# Patient Record
Sex: Female | Born: 1975 | Race: Black or African American | Hispanic: No | Marital: Married | Smoking: Never smoker
Health system: Southern US, Community
[De-identification: ages and names within clinical notes are randomized; demographics above are authoritative.]

## PROBLEM LIST (undated history)

## (undated) DIAGNOSIS — F329 Major depressive disorder, single episode, unspecified: Secondary | ICD-10-CM

## (undated) DIAGNOSIS — T7840XA Allergy, unspecified, initial encounter: Secondary | ICD-10-CM

## (undated) DIAGNOSIS — D649 Anemia, unspecified: Secondary | ICD-10-CM

## (undated) DIAGNOSIS — J302 Other seasonal allergic rhinitis: Secondary | ICD-10-CM

## (undated) DIAGNOSIS — F32A Depression, unspecified: Secondary | ICD-10-CM

## (undated) DIAGNOSIS — K311 Adult hypertrophic pyloric stenosis: Secondary | ICD-10-CM

## (undated) DIAGNOSIS — B019 Varicella without complication: Secondary | ICD-10-CM

## (undated) DIAGNOSIS — J45909 Unspecified asthma, uncomplicated: Secondary | ICD-10-CM

## (undated) DIAGNOSIS — F419 Anxiety disorder, unspecified: Secondary | ICD-10-CM

## (undated) HISTORY — DX: Adult hypertrophic pyloric stenosis: K31.1

## (undated) HISTORY — DX: Varicella without complication: B01.9

## (undated) HISTORY — DX: Allergy, unspecified, initial encounter: T78.40XA

## (undated) HISTORY — DX: Unspecified asthma, uncomplicated: J45.909

## (undated) HISTORY — DX: Anxiety disorder, unspecified: F41.9

## (undated) HISTORY — DX: Anemia, unspecified: D64.9

## (undated) HISTORY — DX: Other seasonal allergic rhinitis: J30.2

---
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
## 1998-11-19 HISTORY — PX: WISDOM TOOTH EXTRACTION: SHX21

## 1999-04-27 ENCOUNTER — Other Ambulatory Visit: Admission: RE | Admit: 1999-04-27 | Discharge: 1999-04-27 | Payer: Self-pay | Admitting: Gynecology

## 2000-01-15 ENCOUNTER — Emergency Department (HOSPITAL_COMMUNITY): Admission: EM | Admit: 2000-01-15 | Discharge: 2000-01-15 | Payer: Self-pay | Admitting: Emergency Medicine

## 2000-03-26 ENCOUNTER — Ambulatory Visit (HOSPITAL_COMMUNITY): Admission: RE | Admit: 2000-03-26 | Discharge: 2000-03-26 | Payer: Self-pay | Admitting: *Deleted

## 2000-08-12 ENCOUNTER — Observation Stay (HOSPITAL_COMMUNITY): Admission: AD | Admit: 2000-08-12 | Discharge: 2000-08-12 | Payer: Self-pay | Admitting: *Deleted

## 2000-08-13 ENCOUNTER — Inpatient Hospital Stay (HOSPITAL_COMMUNITY): Admission: AD | Admit: 2000-08-13 | Discharge: 2000-08-15 | Payer: Self-pay | Admitting: *Deleted

## 2000-08-24 ENCOUNTER — Emergency Department (HOSPITAL_COMMUNITY): Admission: EM | Admit: 2000-08-24 | Discharge: 2000-08-24 | Payer: Self-pay | Admitting: Emergency Medicine

## 2005-01-19 ENCOUNTER — Observation Stay: Payer: Self-pay | Admitting: Certified Nurse Midwife

## 2005-03-06 ENCOUNTER — Inpatient Hospital Stay: Payer: Self-pay | Admitting: Obstetrics and Gynecology

## 2008-01-05 ENCOUNTER — Ambulatory Visit: Payer: Self-pay | Admitting: Family Medicine

## 2008-01-20 ENCOUNTER — Ambulatory Visit: Payer: Self-pay | Admitting: Family Medicine

## 2008-04-19 HISTORY — PX: GASTRIC BYPASS: SHX52

## 2010-09-14 ENCOUNTER — Encounter: Payer: Self-pay | Admitting: Obstetrics and Gynecology

## 2011-04-27 ENCOUNTER — Ambulatory Visit: Payer: Self-pay | Admitting: Obstetrics and Gynecology

## 2011-04-30 ENCOUNTER — Inpatient Hospital Stay: Payer: Self-pay | Admitting: Obstetrics and Gynecology

## 2011-05-07 ENCOUNTER — Emergency Department: Payer: Self-pay | Admitting: Emergency Medicine

## 2013-12-07 ENCOUNTER — Ambulatory Visit: Payer: Self-pay | Admitting: Obstetrics and Gynecology

## 2013-12-14 ENCOUNTER — Ambulatory Visit: Payer: Self-pay | Admitting: Obstetrics and Gynecology

## 2015-04-26 ENCOUNTER — Encounter: Payer: Self-pay | Admitting: Internal Medicine

## 2015-04-26 ENCOUNTER — Ambulatory Visit (INDEPENDENT_AMBULATORY_CARE_PROVIDER_SITE_OTHER): Payer: BC Managed Care – PPO | Admitting: Internal Medicine

## 2015-04-26 ENCOUNTER — Encounter (INDEPENDENT_AMBULATORY_CARE_PROVIDER_SITE_OTHER): Payer: Self-pay

## 2015-04-26 VITALS — BP 108/70 | HR 60 | Temp 97.8°F | Ht 60.5 in | Wt 152.0 lb

## 2015-04-26 DIAGNOSIS — F329 Major depressive disorder, single episode, unspecified: Secondary | ICD-10-CM | POA: Diagnosis not present

## 2015-04-26 DIAGNOSIS — J4520 Mild intermittent asthma, uncomplicated: Secondary | ICD-10-CM

## 2015-04-26 DIAGNOSIS — J302 Other seasonal allergic rhinitis: Secondary | ICD-10-CM | POA: Diagnosis not present

## 2015-04-26 DIAGNOSIS — J45909 Unspecified asthma, uncomplicated: Secondary | ICD-10-CM | POA: Insufficient documentation

## 2015-04-26 DIAGNOSIS — L709 Acne, unspecified: Secondary | ICD-10-CM | POA: Diagnosis not present

## 2015-04-26 DIAGNOSIS — E663 Overweight: Secondary | ICD-10-CM | POA: Insufficient documentation

## 2015-04-26 DIAGNOSIS — F32A Depression, unspecified: Secondary | ICD-10-CM | POA: Insufficient documentation

## 2015-04-26 LAB — COMPREHENSIVE METABOLIC PANEL
ALT: 11 U/L (ref 0–35)
AST: 12 U/L (ref 0–37)
Albumin: 3.8 g/dL (ref 3.5–5.2)
Alkaline Phosphatase: 73 U/L (ref 39–117)
BUN: 12 mg/dL (ref 6–23)
CO2: 29 mEq/L (ref 19–32)
Calcium: 9.2 mg/dL (ref 8.4–10.5)
Chloride: 104 mEq/L (ref 96–112)
Creatinine, Ser: 0.66 mg/dL (ref 0.40–1.20)
GFR: 129.39 mL/min (ref >60.00)
Glucose, Bld: 81 mg/dL (ref 70–99)
Potassium: 4.2 mEq/L (ref 3.5–5.1)
Sodium: 137 mEq/L (ref 135–145)
Total Bilirubin: 0.4 mg/dL (ref 0.2–1.2)
Total Protein: 7.2 g/dL (ref 6.0–8.3)

## 2015-04-26 LAB — TSH: TSH: 1.19 u[IU]/mL (ref 0.35–4.50)

## 2015-04-26 MED ORDER — BENZOYL PEROXIDE 4 % EX GEL: Freq: Every day | CUTANEOUS | Status: DC

## 2015-04-26 NOTE — Progress Notes (Addendum)
HPI  Pt presents to the clinic today to establish care and for management of the conditions listed below.  Childhood asthma: Has not affected her as an adult.  Seasonal Allergies: Worse in the spring. She takes Careers adviserAllegra as needed.  Depression: This is new for her. She feels like it is being triggered by stress at home and at work. She has never been treated for depression in the past. She denies anxiety, SI/HI. She scored 5/27 on PHQ-9. She is interested in treatment for her depression.  Acne: This started within the last year but has gotten worse in the last 2 months. It is all over her face. She is not using anything topically, just washing face with soap and water.  Obesity: s/p gastric bypass in 2009. She reports she has gained 10 lbs over the last 3 months. She wants to lose about 12 pounds. She has been on Phentermine in the past and is interested in starting back on it if she could.  Flu: 2014 Tetanus: 2012 LMP: amenorrheic due to IUD Pap Smear: 11/2014- normal [facility] Yearly: yearly Dentist: biannually  Past Medical History  Diagnosis Date  . Childhood asthma   . Chicken pox   . Allergy     Current Outpatient Prescriptions  Medication Sig Dispense Refill  . levonorgestrel (MIRENA) 20 MCG/24HR IUD 1 each by Intrauterine route once.     No current facility-administered medications for this visit.    No Known Allergies  Family History  Problem Relation Age of Onset  . Hyperlipidemia Mother   . Hypertension Mother   . Hyperlipidemia Maternal Grandmother   . Heart disease Maternal Grandmother   . Hyperlipidemia Maternal Grandfather   . Heart disease Maternal Grandfather   . Hyperlipidemia Paternal Grandmother   . Heart disease Paternal Grandmother   . Hyperlipidemia Paternal Grandfather     History   Social History  . Marital Status: Single    Spouse Name: N/A  . Number of Children: N/A  . Years of Education: N/A    Occupational History  . Not on file.   Social History Main Topics  . Smoking status: Never Smoker   . Smokeless tobacco: Never Used  . Alcohol Use: No  . Drug Use: No  . Sexual Activity: Not on file   Other Topics Concern  . Not on file   Social History Narrative  . No narrative on file    ROS:  Constitutional: Pt reports weight gain. Denies fever, malaise, fatigue, headache.  HEENT: Denies eye pain, eye redness, ear pain, ringing in the ears, wax buildup, runny nose, nasal congestion, bloody nose, or sore throat. Respiratory: Denies difficulty breathing, shortness of breath, cough or sputum production.   Cardiovascular: Denies chest pain, chest tightness, palpitations or swelling in the hands or feet.  Gastrointestinal: Denies abdominal pain, bloating, constipation, diarrhea or blood in the stool.  GU: Denies frequency, urgency, pain with urination, blood in urine, odor or discharge. Musculoskeletal: Denies decrease in range of motion, difficulty with gait, muscle pain or joint pain and swelling.  Skin: Pt reports acne. Denies redness or ulcercations.  Neurological: Denies dizziness, difficulty with memory, difficulty with speech or problems with balance and coordination.  Psych: Pt reports depression. Denies SI/HI.   No other specific complaints in a complete review of systems (except as listed in HPI above).  PE:  BP 108/70 mmHg  Pulse 60  Temp(Src) 97.8 F (36.6 C) (Oral)  Wt 152 lb (68.947 kg)  SpO2 98% Wt Readings from Last 3 Encounters:  04/26/15 152 lb (68.947 kg)    General: Appears her stated age, well developed, well nourished in NAD. Skin: Acne noted on fast. HEENT: Head: normal shape and size; Eyes: sclera white, no icterus, conjunctiva pink;  Nose: mucosa pink and moist, septum midline; Throat/Mouth: Teeth present, mucosa pink and moist, no lesions or ulcerations noted.  Neck: Neck supple, trachea midline. No masses, lumps or thyromegaly present.   Cardiovascular: Normal rate and rhythm. S1,S2 noted.  No murmur, rubs or gallops noted.  Pulmonary/Chest: Normal effort and positive vesicular breath sounds. No respiratory distress. No wheezes, rales or ronchi noted.  Neurological: Alert and oriented.  Psychiatric: Mood and affect mildly flat. Behavior is normal. Judgment and thought content normal.    Assessment and Plan:

## 2015-04-26 NOTE — Assessment & Plan Note (Signed)
Continue to wash face with soap and water eRx for Benzyl Peroxide, use as directed, discussed side effects of redness and dryness Let me know how things are looking in 4 weeks

## 2015-04-26 NOTE — Assessment & Plan Note (Signed)
Continue Allegra prn

## 2015-04-26 NOTE — Assessment & Plan Note (Signed)
Currently not an issue as an adult Will continue to monitor

## 2015-04-26 NOTE — Patient Instructions (Addendum)
I am checking your Thyroid hormone, if abnormal we will treat this and it may improve your depression. If normal, we will treat your depression with an antidepressant. I have called in a medication for your acne. Let me know if 4 weeks if your acne is not improving.  Depression Depression refers to feeling sad, low, down in the dumps, blue, gloomy, or empty. In general, there are two kinds of depression: 1. Normal sadness or normal grief. This kind of depression is one that we all feel from time to time after upsetting life experiences, such as the loss of a job or the ending of a relationship. This kind of depression is considered normal, is short lived, and resolves within a few days to 2 weeks. Depression experienced after the loss of a loved one (bereavement) often lasts longer than 2 weeks but normally gets better with time. 2. Clinical depression. This kind of depression lasts longer than normal sadness or normal grief or interferes with your ability to function at home, at work, and in school. It also interferes with your personal relationships. It affects almost every aspect of your life. Clinical depression is an illness. Symptoms of depression can also be caused by conditions other than those mentioned above, such as:  Physical illness. Some physical illnesses, including underactive thyroid gland (hypothyroidism), severe anemia, specific types of cancer, diabetes, uncontrolled seizures, heart and lung problems, strokes, and chronic pain are commonly associated with symptoms of depression.  Side effects of some prescription medicine. In some people, certain types of medicine can cause symptoms of depression.  Substance abuse. Abuse of alcohol and illicit drugs can cause symptoms of depression. SYMPTOMS Symptoms of normal sadness and normal grief include the following:  Feeling sad or crying for short periods of time.  Not caring about anything (apathy).  Difficulty sleeping or sleeping  too much.  No longer able to enjoy the things you used to enjoy.  Desire to be by oneself all the time (social isolation).  Lack of energy or motivation.  Difficulty concentrating or remembering.  Change in appetite or weight.  Restlessness or agitation. Symptoms of clinical depression include the same symptoms of normal sadness or normal grief and also the following symptoms:  Feeling sad or crying all the time.  Feelings of guilt or worthlessness.  Feelings of hopelessness or helplessness.  Thoughts of suicide or the desire to harm yourself (suicidal ideation).  Loss of touch with reality (psychotic symptoms). Seeing or hearing things that are not real (hallucinations) or having false beliefs about your life or the people around you (delusions and paranoia). DIAGNOSIS  The diagnosis of clinical depression is usually based on how bad the symptoms are and how long they have lasted. Your health care provider will also ask you questions about your medical history and substance use to find out if physical illness, use of prescription medicine, or substance abuse is causing your depression. Your health care provider may also order blood tests. TREATMENT  Often, normal sadness and normal grief do not require treatment. However, sometimes antidepressant medicine is given for bereavement to ease the depressive symptoms until they resolve. The treatment for clinical depression depends on how bad the symptoms are but often includes antidepressant medicine, counseling with a mental health professional, or both. Your health care provider will help to determine what treatment is best for you. Depression caused by physical illness usually goes away with appropriate medical treatment of the illness. If prescription medicine is causing depression, talk with  your health care provider about stopping the medicine, decreasing the dose, or changing to another medicine. Depression caused by the abuse of  alcohol or illicit drugs goes away when you stop using these substances. Some adults need professional help in order to stop drinking or using drugs. SEEK IMMEDIATE MEDICAL CARE IF:  You have thoughts about hurting yourself or others.  You lose touch with reality (have psychotic symptoms).  You are taking medicine for depression and have a serious side effect. FOR MORE INFORMATION  National Alliance on Mental Illness: www.nami.[employer]  National Institute of Mental Health: http://www.maynard.net/www.nimh.nih.gov Document Released: 11/02/2000 Document Revised: 03/22/2014 Document Reviewed: 02/04/2012 [facility] Patient Information 2015 [place], [place]. This information is not intended to replace advice given to you by your health care provider. Make sure you discuss any questions you have with your health care provider.

## 2015-04-26 NOTE — Progress Notes (Signed)
Pre visit review using our clinic review tool, if applicable. No additional management support is needed unless otherwise documented below in the visit note. 

## 2015-04-26 NOTE — Assessment & Plan Note (Signed)
Support offered today Will check her TSH- if abnormal, will treat this before we treat your depression If TSH normal, will go ahead and treat with SSRI Discussed psychotherapy, this is not something she is interested in at this time.

## 2015-04-26 NOTE — Assessment & Plan Note (Signed)
She is interested in starting Phentermine Advised her that if we are going to treat her depression with a SSRI, then she can not take Phentermine due to the risk of serotonin syndrome She will start working harder or diet and exercise

## 2015-04-27 MED ORDER — SERTRALINE HCL 50 MG PO TABS: 50.0000 mg | ORAL_TABLET | Freq: Every day | ORAL | Status: DC

## 2015-04-27 NOTE — Addendum Note (Signed)
Addended by: Roena MaladyEVONTENNO, Antasia Haider Y on: 04/27/2015 08:40 AM   Modules accepted: Orders

## 2015-06-29 ENCOUNTER — Telehealth: Payer: Self-pay

## 2015-06-29 NOTE — Telephone Encounter (Signed)
Pt left v/m; pt has been taking zoloft 50 mg taking one tab at hs. Pt was to cb with update on how doing on zoloft and pt is doing well on zoloft.

## 2015-06-29 NOTE — Telephone Encounter (Signed)
Noted, thanks for the update 

## 2015-06-30 ENCOUNTER — Other Ambulatory Visit: Payer: Self-pay | Admitting: Internal Medicine

## 2015-06-30 NOTE — Telephone Encounter (Signed)
Ok to refill x 1 year 

## 2015-06-30 NOTE — Telephone Encounter (Signed)
Rx sent through e-scribe  

## 2015-06-30 NOTE — Telephone Encounter (Signed)
Since pt called and gave an update on taking medication and is doing well--please advise if okay to refill for 1 year

## 2015-08-22 ENCOUNTER — Encounter: Payer: Self-pay | Admitting: Internal Medicine

## 2015-08-22 ENCOUNTER — Ambulatory Visit (INDEPENDENT_AMBULATORY_CARE_PROVIDER_SITE_OTHER): Payer: BC Managed Care – PPO | Admitting: Internal Medicine

## 2015-08-22 VITALS — BP 102/74 | HR 66 | Temp 98.2°F | Wt 149.0 lb

## 2015-08-22 DIAGNOSIS — J069 Acute upper respiratory infection, unspecified: Secondary | ICD-10-CM

## 2015-08-22 DIAGNOSIS — T3695XA Adverse effect of unspecified systemic antibiotic, initial encounter: Secondary | ICD-10-CM

## 2015-08-22 DIAGNOSIS — B379 Candidiasis, unspecified: Secondary | ICD-10-CM

## 2015-08-22 MED ORDER — AZITHROMYCIN 250 MG PO TABS: ORAL_TABLET | ORAL | Status: DC

## 2015-08-22 MED ORDER — FLUCONAZOLE 150 MG PO TABS: 150.0000 mg | ORAL_TABLET | Freq: Once | ORAL | Status: DC

## 2015-08-22 NOTE — Progress Notes (Signed)
HPI  Pt presents to the clinic today with c/o fatigue, sore throat, cough and chest congestion. This started 5 days ago. She denies difficulty swallowing. The cough is productive of yellow mucous. She has not checked to see if she is running a fever but she has had chills. She has taken Mucinex and Nyquil with some relief. She does have a history of seasonal allergies. She has not had sick contacts that she is aware of.  Review of Systems      Past Medical History  Diagnosis Date  . Childhood asthma   . Chicken pox   . Allergy     Family History  Problem Relation Age of Onset  . Hyperlipidemia Mother   . Hypertension Mother   . Hyperlipidemia Maternal Grandmother   . Heart disease Maternal Grandmother   . Hyperlipidemia Maternal Grandfather   . Heart disease Maternal Grandfather   . Hyperlipidemia Paternal Grandmother   . Heart disease Paternal Grandmother   . Hyperlipidemia Paternal Grandfather     Social History   Social History  . Marital Status: Single    Spouse Name: N/A  . Number of Children: N/A  . Years of Education: N/A   Occupational History  . Not on file.   Social History Main Topics  . Smoking status: Never Smoker   . Smokeless tobacco: Never Used  . Alcohol Use: No  . Drug Use: No  . Sexual Activity: Yes    Birth Control/ Protection: IUD   Other Topics Concern  . Not on file   Social History Narrative    No Known Allergies   Constitutional: Positive fatigue. Denies headache, fever or abrupt weight changes.  HEENT:  Positive sore throat. Denies eye redness, eye pain, pressure behind the eyes, facial pain, nasal congestion, ear pain, ringing in the ears, wax buildup, runny nose or bloody nose. Respiratory: Positive cough. Denies difficulty breathing or shortness of breath.  Cardiovascular: Denies chest pain, chest tightness, palpitations or swelling in the hands or feet.   No other specific complaints in a complete review of systems (except as  listed in HPI above).  Objective:   BP 102/74 mmHg  Pulse 66  Temp(Src) 98.2 F (36.8 C) (Oral)  Wt 149 lb (67.586 kg)  SpO2 97% Wt Readings from Last 3 Encounters:  08/22/15 149 lb (67.586 kg)  04/26/15 152 lb (68.947 kg)     General: Appears her stated age, well developed, well nourished in NAD. HEENT: Head: normal shape and size; Eyes: sclera white, no icterus, conjunctiva pink; Ears: Tm's gray and intact, normal light reflex; Nose: mucosa pink and moist, septum midline; Throat/Mouth: Teeth present, mucosa pink and moist, no exudate noted, no lesions or ulcerations noted.  Neck: Cervical lymphadenopathy noted.  Cardiovascular: Normal rate and rhythm. S1,S2 noted.  No murmur, rubs or gallops noted.  Pulmonary/Chest: Normal effort and diminished in the RML, RLL. No respiratory distress. No wheezes, rales or ronchi noted.      Assessment & Plan:   Upper Respiratory Infection:  Get some rest and drink plenty of water Do salt water gargles for the sore throat eRx for Azithromax x 5 days eRx for Diflucan 150 mg PO x 1 for antibiotic induced yeast infection Continue Mucinex and Nyquil  Work note to return tomorrow or sooner if better  RTC as needed or if symptoms persist.

## 2015-08-22 NOTE — Patient Instructions (Signed)
Upper Respiratory Infection, Adult An upper respiratory infection (URI) is also sometimes known as the common cold. The upper respiratory tract includes the nose, sinuses, throat, trachea, and bronchi. Bronchi are the airways leading to the lungs. Most people improve within 1 week, but symptoms can last up to 2 weeks. A residual cough may last even longer.  CAUSES Many different viruses can infect the tissues lining the upper respiratory tract. The tissues become irritated and inflamed and often become very moist. Mucus production is also common. A cold is contagious. You can easily spread the virus to others by oral contact. This includes kissing, sharing a glass, coughing, or sneezing. Touching your mouth or nose and then touching a surface, which is then touched by another person, can also spread the virus. SYMPTOMS  Symptoms typically develop 1 to 3 days after you come in contact with a cold virus. Symptoms vary from person to person. They may include:  Runny nose.  Sneezing.  Nasal congestion.  Sinus irritation.  Sore throat.  Loss of voice (laryngitis).  Cough.  Fatigue.  Muscle aches.  Loss of appetite.  Headache.  Low-grade fever. DIAGNOSIS  You might diagnose your own cold based on familiar symptoms, since most people get a cold 2 to 3 times a year. Your caregiver can confirm this based on your exam. Most importantly, your caregiver can check that your symptoms are not due to another disease such as strep throat, sinusitis, pneumonia, asthma, or epiglottitis. Blood tests, throat tests, and X-rays are not necessary to diagnose a common cold, but they may sometimes be helpful in excluding other more serious diseases. Your caregiver will decide if any further tests are required. RISKS AND COMPLICATIONS  You may be at risk for a more severe case of the common cold if you smoke cigarettes, have chronic heart disease (such as heart failure) or lung disease (such as asthma), or if  you have a weakened immune system. The very young and very old are also at risk for more serious infections. Bacterial sinusitis, middle ear infections, and bacterial pneumonia can complicate the common cold. The common cold can worsen asthma and chronic obstructive pulmonary disease (COPD). Sometimes, these complications can require emergency medical care and may be life-threatening. PREVENTION  The best way to protect against getting a cold is to practice good hygiene. Avoid oral or hand contact with people with cold symptoms. Wash your hands often if contact occurs. There is no clear evidence that vitamin C, vitamin E, echinacea, or exercise reduces the chance of developing a cold. However, it is always recommended to get plenty of rest and practice good nutrition. TREATMENT  Treatment is directed at relieving symptoms. There is no cure. Antibiotics are not effective, because the infection is caused by a virus, not by bacteria. Treatment may include:  Increased fluid intake. Sports drinks offer valuable electrolytes, sugars, and fluids.  Breathing heated mist or steam (vaporizer or shower).  Eating chicken soup or other clear broths, and maintaining good nutrition.  Getting plenty of rest.  Using gargles or lozenges for comfort.  Controlling fevers with ibuprofen or acetaminophen as directed by your caregiver.  Increasing usage of your inhaler if you have asthma. Zinc gel and zinc lozenges, taken in the first 24 hours of the common cold, can shorten the duration and lessen the severity of symptoms. Pain medicines may help with fever, muscle aches, and throat pain. A variety of non-prescription medicines are available to treat congestion and runny nose. Your caregiver   can make recommendations and may suggest nasal or lung inhalers for other symptoms.  HOME CARE INSTRUCTIONS   Only take over-the-counter or prescription medicines for pain, discomfort, or fever as directed by your  caregiver.  Use a warm mist humidifier or inhale steam from a shower to increase air moisture. This may keep secretions moist and make it easier to breathe.  Drink enough water and fluids to keep your urine clear or pale yellow.  Rest as needed.  Return to work when your temperature has returned to normal or as your caregiver advises. You may need to stay home longer to avoid infecting others. You can also use a face mask and careful hand washing to prevent spread of the virus. SEEK MEDICAL CARE IF:   After the first few days, you feel you are getting worse rather than better.  You need your caregiver's advice about medicines to control symptoms.  You develop chills, worsening shortness of breath, or brown or red sputum. These may be signs of pneumonia.  You develop yellow or brown nasal discharge or pain in the face, especially when you bend forward. These may be signs of sinusitis.  You develop a fever, swollen neck glands, pain with swallowing, or white areas in the back of your throat. These may be signs of strep throat. SEEK IMMEDIATE MEDICAL CARE IF:   You have a fever.  You develop severe or persistent headache, ear pain, sinus pain, or chest pain.  You develop wheezing, a prolonged cough, cough up blood, or have a change in your usual mucus (if you have chronic lung disease).  You develop sore muscles or a stiff neck. Document Released: 05/01/2001 Document Revised: 01/28/2012 Document Reviewed: 02/10/2014 ExitCare Patient Information 2015 Exit[employer], LLC. This information is not intended to replace advice given to you by your health care provider. Make sure you discuss any questions you have with your health care provider.  

## 2015-08-22 NOTE — Progress Notes (Signed)
Pre visit review using our clinic review tool, if applicable. No additional management support is needed unless otherwise documented below in the visit note. 

## 2015-09-30 ENCOUNTER — Ambulatory Visit (INDEPENDENT_AMBULATORY_CARE_PROVIDER_SITE_OTHER): Payer: BC Managed Care – PPO | Admitting: Family Medicine

## 2015-09-30 ENCOUNTER — Encounter: Payer: Self-pay | Admitting: Family Medicine

## 2015-09-30 ENCOUNTER — Telehealth: Payer: Self-pay | Admitting: Internal Medicine

## 2015-09-30 VITALS — BP 114/80 | HR 51 | Temp 97.8°F | Ht 60.5 in | Wt 150.5 lb

## 2015-09-30 DIAGNOSIS — N907 Vulvar cyst: Secondary | ICD-10-CM | POA: Insufficient documentation

## 2015-09-30 DIAGNOSIS — R350 Frequency of micturition: Secondary | ICD-10-CM

## 2015-09-30 DIAGNOSIS — N3000 Acute cystitis without hematuria: Secondary | ICD-10-CM | POA: Diagnosis not present

## 2015-09-30 DIAGNOSIS — N390 Urinary tract infection, site not specified: Secondary | ICD-10-CM | POA: Insufficient documentation

## 2015-09-30 LAB — POCT URINALYSIS DIPSTICK
Bilirubin, UA: NEGATIVE
Blood, UA: NEGATIVE
Glucose, UA: NEGATIVE
Ketones, UA: NEGATIVE
Nitrite, UA: NEGATIVE
Protein, UA: NEGATIVE
Spec Grav, UA: 1.015
Urobilinogen, UA: 0.2
pH, UA: 6

## 2015-09-30 MED ORDER — FLUCONAZOLE 150 MG PO TABS: 150.0000 mg | ORAL_TABLET | Freq: Once | ORAL | Status: DC

## 2015-09-30 MED ORDER — LEVOFLOXACIN 500 MG PO TABS: 500.0000 mg | ORAL_TABLET | Freq: Every day | ORAL | Status: DC

## 2015-09-30 NOTE — Telephone Encounter (Signed)
Pt made my chart appointment for 11/14 is it ok to wait  See below my chart message  Appointment For: Lisa RibasCANTEY,Lariah T ([id number])    Visit Type: MYCHART OFFICE VISIT (1064)      10/03/2015 11:15 AM 15 mins. Lorre Munroeegina W Baity, NP    [facility]      Patient Comments:   Office Visit   Frequent urination, abdominal pain, back pain

## 2015-09-30 NOTE — Patient Instructions (Signed)
Take levaquin 500 mg once daily with food (for boil and also uti) Drink lots of water Use warm compresses or sitz baths as needed for boil Update if not starting to improve in a week or if worsening    We will contact you with urine culture result when it returns

## 2015-09-30 NOTE — Progress Notes (Signed)
Pre visit review using our clinic review tool, if applicable. No additional management support is needed unless otherwise documented below in the visit note. 

## 2015-09-30 NOTE — Progress Notes (Signed)
Subjective:    Patient ID: Lisa Rosario, female    DOB: 1976-04-18, 39 y.o.   MRN: [id number]  HPI Here with urinary symptoms   Frequent urination 1 week  Also low back pain  No blood in urine Has regular periods  Uses condoms for birth control  A little burning to urinate at the end of urination  No fever or n/v   Has had uti in the past -not often  Results for orders placed or performed in visit on 09/30/15  POCT urinalysis dipstick  Result Value Ref Range   Color, UA Yellow    Clarity, UA Hazy    Glucose, UA Neg.    Bilirubin, UA Neg.    Ketones, UA Neg.    Spec Grav, UA 1.015    Blood, UA Neg.    pH, UA 6.0    Protein, UA Neg.    Urobilinogen, UA 0.2    Nitrite, UA Neg.    Leukocytes, UA small (1+) (A) Negative   does not drink lots of water    Had a boil in vaginal area - got rid of one and another one came up  This one is about a month Is using warm compresses   On chronic metrogel for BV  Patient Active Problem List   Diagnosis Date Noted  . UTI (urinary tract infection) 09/30/2015  . Vulvar cyst 09/30/2015  . Childhood asthma 04/26/2015  . Seasonal allergies 04/26/2015  . Acne 04/26/2015  . Depression 04/26/2015  . Overweight (BMI 25.0-29.9) 04/26/2015   Past Medical History  Diagnosis Date  . Childhood asthma   . Chicken pox   . Allergy    Past Surgical History  Procedure Laterality Date  . Cesarean section    . Gastric bypass  04/2008   Social History  Substance Use Topics  . Smoking status: Never Smoker   . Smokeless tobacco: Never Used  . Alcohol Use: No   Family History  Problem Relation Age of Onset  . Hyperlipidemia Mother   . Hypertension Mother   . Hyperlipidemia Maternal Grandmother   . Heart disease Maternal Grandmother   . Hyperlipidemia Maternal Grandfather   . Heart disease Maternal Grandfather   . Hyperlipidemia Paternal Grandmother   . Heart disease Paternal Grandmother   . Hyperlipidemia Paternal Grandfather     No Known Allergies Current Outpatient Prescriptions on File Prior to Visit  Medication Sig Dispense Refill  . benzoyl peroxide (BREVOXYL) 4 % gel Apply topically at bedtime. 42.5 g 0  . metroNIDAZOLE (METROGEL) 0.75 % vaginal gel twice a week  X 4 months    . sertraline (ZOLOFT) 50 MG tablet TAKE 1 TABLET (50 MG TOTAL) BY MOUTH AT BEDTIME. 30 tablet 11   No current facility-administered medications on file prior to visit.    Review of Systems Review of Systems  Constitutional: Negative for fever, appetite change, fatigue and unexpected weight change.  Eyes: Negative for pain and visual disturbance.  Respiratory: Negative for cough and shortness of breath.   Cardiovascular: Negative for cp or palpitations    Gastrointestinal: Negative for nausea, diarrhea and constipation.  Genitourinary:pos for urgency and frequency.  neg for hematuria and flank pain pos for vulvar boil and pain Skin: Negative for pallor or rash   Neurological: Negative for weakness, light-headedness, numbness and headaches.  Hematological: Negative for adenopathy. Does not bruise/bleed easily.  Psychiatric/Behavioral: Negative for dysphoric mood. The patient is not nervous/anxious.  Objective:   Physical Exam  Constitutional: She appears well-developed and well-nourished. No distress.  overwt and well app  HENT:  Head: Normocephalic and atraumatic.  Eyes: Conjunctivae and EOM are normal. Pupils are equal, round, and reactive to light.  Neck: Normal range of motion. Neck supple.  Cardiovascular: Normal rate, regular rhythm and normal heart sounds.   Pulmonary/Chest: Effort normal and breath sounds normal.  Abdominal: Soft. Bowel sounds are normal. She exhibits no distension. There is tenderness. There is no rebound.  No cva tenderness  Mild suprapubic tenderness  Genitourinary:  1 by 1.5 cm of erythema and induration in fold between labia minora and majora on the L  No drainage Tender but not  fluctuant     Musculoskeletal: She exhibits no edema.  Lymphadenopathy:    She has no cervical adenopathy.  Neurological: She is alert.  Skin: No rash noted. There is erythema.  Psychiatric: She has a normal mood and affect.          Assessment & Plan:   Problem List Items Addressed This Visit      Genitourinary   UTI (urinary tract infection)    Borderline ua with symptoms  Enc water intake  Cover with levaquin pending urine culture  Update if not starting to improve in a week or if worsening        Relevant Medications   fluconazole (DIFLUCAN) 150 MG tablet   Other Relevant Orders   Urine culture   Vulvar cyst    In pt with hx of cysts that turn into boils  This one is not fluctuant -not ready for drainage  Cover with levaquin  Warm compresses frequently  Disc cleansing  Update if not starting to improve in a week or if worsening         Other Visit Diagnoses    Urinary frequency    -  Primary    Relevant Orders    POCT urinalysis dipstick (Completed)

## 2015-10-01 LAB — URINE CULTURE: Colony Count: 50000

## 2015-10-01 NOTE — Assessment & Plan Note (Signed)
In pt with hx of cysts that turn into boils  This one is not fluctuant -not ready for drainage  Cover with levaquin  Warm compresses frequently  Disc cleansing  Update if not starting to improve in a week or if worsening

## 2015-10-01 NOTE — Assessment & Plan Note (Signed)
Borderline ua with symptoms  Enc water intake  Cover with levaquin pending urine culture  Update if not starting to improve in a week or if worsening

## 2015-10-03 ENCOUNTER — Ambulatory Visit: Payer: Self-pay | Admitting: Internal Medicine

## 2015-12-01 ENCOUNTER — Encounter: Payer: Self-pay | Admitting: Internal Medicine

## 2015-12-01 ENCOUNTER — Ambulatory Visit (INDEPENDENT_AMBULATORY_CARE_PROVIDER_SITE_OTHER): Payer: BC Managed Care – PPO | Admitting: Internal Medicine

## 2015-12-01 VITALS — BP 112/70 | HR 61 | Temp 98.0°F | Wt 152.0 lb

## 2015-12-01 DIAGNOSIS — B373 Candidiasis of vulva and vagina: Secondary | ICD-10-CM

## 2015-12-01 DIAGNOSIS — B3731 Acute candidiasis of vulva and vagina: Secondary | ICD-10-CM

## 2015-12-01 MED ORDER — FLUCONAZOLE 100 MG PO TABS: 100.0000 mg | ORAL_TABLET | Freq: Every day | ORAL | Status: DC

## 2015-12-01 NOTE — Progress Notes (Signed)
Subjective:    Patient ID: Lisa RibasJoy T Edmonds, female    DOB: [birth year], 40 y.o.   MRN: [id number]  HPI  Pt presents to the clinic today with c/o vaginal burning and irritation. This started 1 week ago. She denies vaginal discharge or odor. She denies urinary complaints. She has not tried anything OTC. She is sexually active with 1 partner. They do use condoms. She denies douching, scented soaps or use of summer's eve. Her LMP was 11/07/2015.  Review of Systems       Past Medical History  Diagnosis Date  . Childhood asthma   . Chicken pox   . Allergy     Current Outpatient Prescriptions  Medication Sig Dispense Refill  . benzoyl peroxide (BREVOXYL) 4 % gel Apply topically at bedtime. 42.5 g 0  . fluconazole (DIFLUCAN) 150 MG tablet Take 1 tablet (150 mg total) by mouth once. As needed for yeast infection 1 tablet 0  . sertraline (ZOLOFT) 50 MG tablet TAKE 1 TABLET (50 MG TOTAL) BY MOUTH AT BEDTIME. 30 tablet 11  . metroNIDAZOLE (METROGEL) 0.75 % vaginal gel Reported on 12/01/2015     No current facility-administered medications for this visit.    No Known Allergies  Family History  Problem Relation Age of Onset  . Hyperlipidemia Mother   . Hypertension Mother   . Hyperlipidemia Maternal Grandmother   . Heart disease Maternal Grandmother   . Hyperlipidemia Maternal Grandfather   . Heart disease Maternal Grandfather   . Hyperlipidemia Paternal Grandmother   . Heart disease Paternal Grandmother   . Hyperlipidemia Paternal Grandfather     Social History   Social History  . Marital Status: Single    Spouse Name: N/A  . Number of Children: N/A  . Years of Education: N/A   Occupational History  . Not on file.   Social History Main Topics  . Smoking status: Never Smoker   . Smokeless tobacco: Never Used  . Alcohol Use: No  . Drug Use: No  . Sexual Activity: Yes    Birth Control/ Protection: IUD   Other Topics Concern  . Not on file   Social History Narrative      Constitutional: Denies fever, malaise, fatigue, headache or abrupt weight changes.  Respiratory: Denies difficulty breathing, shortness of breath, cough or sputum production.   Cardiovascular: Denies chest pain, chest tightness, palpitations or swelling in the hands or feet.  Gastrointestinal: Denies abdominal pain, bloating, constipation, diarrhea or blood in the stool.  GU: Pt reports vaginal irritation. Denies urgency, frequency, pain with urination, burning sensation, blood in urine, odor or discharge.  No other specific complaints in a complete review of systems (except as listed in HPI above).  Objective:   Physical Exam   BP 112/70 mmHg  Pulse 61  Temp(Src) 98 F (36.7 C) (Oral)  Wt 152 lb (68.947 kg)  SpO2 98%  LMP 11/07/2015 Wt Readings from Last 3 Encounters:  12/01/15 152 lb (68.947 kg)  09/30/15 150 lb 8 oz (68.266 kg)  08/22/15 149 lb (67.586 kg)    General: Appears her stated age, well developed, well nourished in NAD. Cardiovascular: Normal rate and rhythm. S1,S2 noted.  No murmur, rubs or gallops noted.  Pulmonary/Chest: Normal effort and positive vesicular breath sounds. No respiratory distress. No wheezes, rales or ronchi noted.  Abdomen: Soft and nontender. . Pelvic: Normal female anatomy. Thick white discharge noted at the vaginal opening.  BMET    Component Value Date/Time   NA 137  04/26/2015 0956   K 4.2 04/26/2015 0956   CL 104 04/26/2015 0956   CO2 29 04/26/2015 0956   GLUCOSE 81 04/26/2015 0956   BUN 12 04/26/2015 0956   CREATININE 0.66 04/26/2015 0956   CALCIUM 9.2 04/26/2015 0956    Lipid Panel  No results found for: CHOL, TRIG, HDL, CHOLHDL, VLDL, LDLCALC  CBC No results found for: WBC, RBC, HGB, HCT, PLT, MCV, MCH, MCHC, RDW, LYMPHSABS, MONOABS, EOSABS, BASOSABS  Hgb A1C No results found for: HGBA1C      Assessment & Plan:   Candida Vaginitis:  Wet prep: + yeast, no clue eRx for Fluconazole 100 mg daily x 7 days If  returns or persist, I recommend she follow up with GYN  RTC as needed or if symptoms persist or worsen

## 2015-12-01 NOTE — Patient Instructions (Signed)

## 2015-12-01 NOTE — Progress Notes (Signed)
Pre visit review using our clinic review tool, if applicable. No additional management support is needed unless otherwise documented below in the visit note. 

## 2016-01-04 ENCOUNTER — Ambulatory Visit (INDEPENDENT_AMBULATORY_CARE_PROVIDER_SITE_OTHER): Payer: BC Managed Care – PPO | Admitting: Internal Medicine

## 2016-01-04 ENCOUNTER — Encounter: Payer: Self-pay | Admitting: Internal Medicine

## 2016-01-04 VITALS — BP 106/70 | HR 81 | Temp 99.8°F | Wt 156.0 lb

## 2016-01-04 DIAGNOSIS — B379 Candidiasis, unspecified: Secondary | ICD-10-CM

## 2016-01-04 DIAGNOSIS — J029 Acute pharyngitis, unspecified: Secondary | ICD-10-CM

## 2016-01-04 DIAGNOSIS — T3695XA Adverse effect of unspecified systemic antibiotic, initial encounter: Principal | ICD-10-CM

## 2016-01-04 LAB — POCT RAPID STREP A (OFFICE): RAPID STREP A SCREEN: NEGATIVE

## 2016-01-04 MED ORDER — AMOXICILLIN 500 MG PO CAPS: 500.0000 mg | ORAL_CAPSULE | Freq: Three times a day (TID) | ORAL | Status: DC

## 2016-01-04 MED ORDER — FLUCONAZOLE 100 MG PO TABS: 100.0000 mg | ORAL_TABLET | Freq: Every day | ORAL | Status: DC

## 2016-01-04 NOTE — Addendum Note (Signed)
Addended by: Roena Malady on: 01/04/2016 01:17 PM   Modules accepted: Orders

## 2016-01-04 NOTE — Progress Notes (Signed)
Pre visit review using our clinic review tool, if applicable. No additional management support is needed unless otherwise documented below in the visit note. 

## 2016-01-04 NOTE — Progress Notes (Signed)
Subjective:    Patient ID: Lisa Rosario, female    DOB: 1976-02-25, 40 y.o.   MRN: [id number]  HPI  Pt presents to the clinic today with c/o sore throat. This started yesterday. She has some associated facial pressure, but denies runny nose, nasal congestion, ear fullness or cough. She has run low grade fevers, but denies chills or body aches. She has not noticed a rash. She has not taken anything OTC. She does have a history of seasonal allergies and childhood asthma. She reports her child was recently diagnosed with Scarlet Fever.  Review of Systems  Past Medical History  Diagnosis Date  . Childhood asthma   . Chicken pox   . Allergy     Current Outpatient Prescriptions  Medication Sig Dispense Refill  . sertraline (ZOLOFT) 50 MG tablet TAKE 1 TABLET (50 MG TOTAL) BY MOUTH AT BEDTIME. 30 tablet 11   No current facility-administered medications for this visit.    No Known Allergies  Family History  Problem Relation Age of Onset  . Hyperlipidemia Mother   . Hypertension Mother   . Hyperlipidemia Maternal Grandmother   . Heart disease Maternal Grandmother   . Hyperlipidemia Maternal Grandfather   . Heart disease Maternal Grandfather   . Hyperlipidemia Paternal Grandmother   . Heart disease Paternal Grandmother   . Hyperlipidemia Paternal Grandfather     Social History   Social History  . Marital Status: Single    Spouse Name: N/A  . Number of Children: N/A  . Years of Education: N/A   Occupational History  . Not on file.   Social History Main Topics  . Smoking status: Never Smoker   . Smokeless tobacco: Never Used  . Alcohol Use: No  . Drug Use: No  . Sexual Activity: Yes    Birth Control/ Protection: IUD   Other Topics Concern  . Not on file   Social History Narrative     Constitutional: Denies fever, malaise, fatigue, headache or abrupt weight changes.  HEENT: Pt reports sore throat. Denies eye pain, eye redness, ear pain, ringing in the ears, wax  buildup, runny nose, nasal congestion, bloody nose. Respiratory: Denies difficulty breathing, shortness of breath, cough or sputum production.   Cardiovascular: Denies chest pain, chest tightness, palpitations or swelling in the hands or feet.  Gastrointestinal: Denies abdominal pain, bloating, constipation, diarrhea or blood in the stool.  Skin: Denies redness, rashes, lesions or ulcercations.    No other specific complaints in a complete review of systems (except as listed in HPI above).     Objective:   Physical Exam  BP 106/70 mmHg  Pulse 81  Temp(Src) 99.8 F (37.7 C) (Oral)  Wt 156 lb (70.761 kg)  SpO2 98%  LMP 12/07/2015 Wt Readings from Last 3 Encounters:  01/04/16 156 lb (70.761 kg)  12/01/15 152 lb (68.947 kg)  09/30/15 150 lb 8 oz (68.266 kg)    General: Appears her stated age, ill appearing in NAD. Skin: Warm, dry and intact. No rashes, lesions or ulcerations noted. HEENT: Head: normal shape and size, no sinus tenderness noted; Eyes: sclera white, no icterus, conjunctiva pink; Ears: Tm's gray and intact, normal light reflex; Throat/Mouth: Teeth present, mucosa erythematous and moist, tonsil 2+, white patchy exudate noted on bilateral tonsils. No lesions or ulcerations noted.  Neck:  No adenopathy noted. Cardiovascular: Normal rate and rhythm. S1,S2 noted.   Pulmonary/Chest: Normal effort and positive vesicular breath sounds. No respiratory distress. No wheezes, rales or ronchi noted.  BMET    Component Value Date/Time   NA 137 04/26/2015 0956   K 4.2 04/26/2015 0956   CL 104 04/26/2015 0956   CO2 29 04/26/2015 0956   GLUCOSE 81 04/26/2015 0956   BUN 12 04/26/2015 0956   CREATININE 0.66 04/26/2015 0956   CALCIUM 9.2 04/26/2015 0956          Assessment & Plan:   Sore throat:  Rapid strep: neg Will send throat culture Will treat based on [facility] criteria eRx for Amoxil 500 mg TID x 10 days eRx for Diflucan 150 mg PO x 1 for antibiotic induced yeast  infection Advised rest, fluids and Ibuprofen for fever  Return precautions given

## 2016-01-04 NOTE — Patient Instructions (Signed)

## 2016-01-06 LAB — CULTURE, GROUP A STREP

## 2016-01-18 ENCOUNTER — Other Ambulatory Visit: Payer: Self-pay

## 2016-01-18 MED ORDER — SERTRALINE HCL 50 MG PO TABS: 50.0000 mg | ORAL_TABLET | Freq: Every day | ORAL | Status: DC

## 2016-05-10 ENCOUNTER — Ambulatory Visit: Payer: BC Managed Care – PPO | Admitting: Internal Medicine

## 2016-05-17 ENCOUNTER — Ambulatory Visit (INDEPENDENT_AMBULATORY_CARE_PROVIDER_SITE_OTHER): Payer: BC Managed Care – PPO | Admitting: Internal Medicine

## 2016-05-17 ENCOUNTER — Encounter: Payer: Self-pay | Admitting: Internal Medicine

## 2016-05-17 VITALS — BP 104/60 | HR 55 | Temp 97.8°F | Wt 168.8 lb

## 2016-05-17 DIAGNOSIS — F32A Depression, unspecified: Secondary | ICD-10-CM

## 2016-05-17 DIAGNOSIS — J4520 Mild intermittent asthma, uncomplicated: Secondary | ICD-10-CM | POA: Diagnosis not present

## 2016-05-17 DIAGNOSIS — L709 Acne, unspecified: Secondary | ICD-10-CM

## 2016-05-17 DIAGNOSIS — J302 Other seasonal allergic rhinitis: Secondary | ICD-10-CM | POA: Diagnosis not present

## 2016-05-17 DIAGNOSIS — R61 Generalized hyperhidrosis: Secondary | ICD-10-CM

## 2016-05-17 DIAGNOSIS — F329 Major depressive disorder, single episode, unspecified: Secondary | ICD-10-CM

## 2016-05-17 DIAGNOSIS — E663 Overweight: Secondary | ICD-10-CM

## 2016-05-17 MED ORDER — FLUOXETINE HCL 20 MG PO TABS: 20.0000 mg | ORAL_TABLET | Freq: Every day | ORAL | Status: DC

## 2016-05-17 NOTE — Progress Notes (Signed)
HPI  Pt presents to the clinic today for follow up of chronic conditions.  Childhood asthma: Has not affected her as an adult.  Seasonal Allergies: Worse in the spring. She takes Careers adviserAllegra as needed.  Depression: She feels like it is being triggered by stress at home and at work. She is doing well on her current dose of Zoloft although she is concerned that she has gained some weight. She would like to consider different options.  Acne: This started within the last year but has gotten worse in the last 2 months. It is all over her face. She is not using anything topically, just washing face with soap and water.  Obesity: s/p gastric bypass in 2009. She reports she has gained 16 lbs over the last 6 months.   Excessive sweating: She is taking Glycopyrrolate with good results. She does have some dry mouth but she can deal with.  Past Medical History  Diagnosis Date  . Childhood asthma   . Chicken pox   . Allergy     Current Outpatient Prescriptions  Medication Sig Dispense Refill  . amoxicillin (AMOXIL) 500 MG capsule Take 1 capsule (500 mg total) by mouth 3 (three) times daily. 30 capsule 0  . fluconazole (DIFLUCAN) 100 MG tablet Take 1 tablet (100 mg total) by mouth daily. 14 tablet 0  . sertraline (ZOLOFT) 50 MG tablet Take 1 tablet (50 mg total) by mouth at bedtime. 90 tablet 1   No current facility-administered medications for this visit.    No Known Allergies  Family History  Problem Relation Age of Onset  . Hyperlipidemia Mother   . Hypertension Mother   . Hyperlipidemia Maternal Grandmother   . Heart disease Maternal Grandmother   . Hyperlipidemia Maternal Grandfather   . Heart disease Maternal Grandfather   . Hyperlipidemia Paternal Grandmother   . Heart disease Paternal Grandmother   . Hyperlipidemia Paternal Grandfather     Social History   Social History  . Marital Status: Single    Spouse Name: N/A  . Number of Children: N/A  . Years of Education: N/A    Occupational History  . Not on file.   Social History Main Topics  . Smoking status: Never Smoker   . Smokeless tobacco: Never Used  . Alcohol Use: No  . Drug Use: No  . Sexual Activity: Yes    Birth Control/ Protection: IUD   Other Topics Concern  . Not on file   Social History Narrative    ROS:  Constitutional: Pt reports weight gain. Denies fever, malaise, fatigue, headache.  HEENT: Denies eye pain, eye redness, ear pain, ringing in the ears, wax buildup, runny nose, nasal congestion, bloody nose, or sore throat. Respiratory: Denies difficulty breathing, shortness of breath, cough or sputum production.   Cardiovascular: Denies chest pain, chest tightness, palpitations or swelling in the hands or feet.  Gastrointestinal: Denies abdominal pain, bloating, constipation, diarrhea or blood in the stool.  GU: Denies frequency, urgency, pain with urination, blood in urine, odor or discharge. Musculoskeletal: Denies decrease in range of motion, difficulty with gait, muscle pain or joint pain and swelling.  Skin: Pt reports acne. Denies redness or ulcercations.  Neurological: Denies dizziness, difficulty with memory, difficulty with speech or problems with balance and coordination.  Psych: Pt reports depression. Denies SI/HI.   No other specific complaints in a complete review of systems (except as listed in HPI above).  PE:  BP 104/60 mmHg  Pulse 55  Temp(Src) 97.8 F (36.6  C) (Oral)  Wt 168 lb 12 oz (76.544 kg)  SpO2 98%  LMP 05/06/2016  Wt Readings from Last 3 Encounters:  01/04/16 156 lb (70.761 kg)  12/01/15 152 lb (68.947 kg)  09/30/15 150 lb 8 oz (68.266 kg)    General: Appears her stated age, obese in NAD. Skin: Acne noted on fast. HEENT: Head: normal shape and size; Eyes: sclera white, no icterus, conjunctiva pink;  Nose: mucosa pink and moist, septum midline; Throat/Mouth: Teeth present, mucosa pink and moist, no lesions or ulcerations noted.   Cardiovascular: Normal rate and rhythm. S1,S2 noted.  No murmur, rubs or gallops noted.  Pulmonary/Chest: Normal effort and positive vesicular breath sounds. No respiratory distress. No wheezes, rales or ronchi noted.  Neurological: Alert and oriented.  Psychiatric: Mood and affect mildly flat. Behavior is normal. Judgment and thought content normal.    Assessment and Plan:

## 2016-05-17 NOTE — Assessment & Plan Note (Signed)
Encouraged her to work on diet and exercise 

## 2016-05-17 NOTE — Assessment & Plan Note (Signed)
She wants to continue conservative care at this time Will monitor

## 2016-05-17 NOTE — Assessment & Plan Note (Signed)
Improved but she is upset about weight gain Stop Zoloft, start Prozac Try to increase exercise  Follow up with in 4 weeks via mychart

## 2016-05-17 NOTE — Assessment & Plan Note (Signed)
Controlled on Glycopyrrolate Will monitor

## 2016-05-17 NOTE — Assessment & Plan Note (Signed)
Currently not an issue Will monitor 

## 2016-05-17 NOTE — Assessment & Plan Note (Signed)
Continue Allegra as needed 

## 2016-05-17 NOTE — Patient Instructions (Signed)

## 2016-05-17 NOTE — Progress Notes (Signed)
Pre visit review using our clinic review tool, if applicable. No additional management support is needed unless otherwise documented below in the visit note. 

## 2016-08-09 ENCOUNTER — Other Ambulatory Visit: Payer: Self-pay | Admitting: Internal Medicine

## 2016-08-12 ENCOUNTER — Encounter: Payer: Self-pay | Admitting: Internal Medicine

## 2016-08-17 ENCOUNTER — Ambulatory Visit (INDEPENDENT_AMBULATORY_CARE_PROVIDER_SITE_OTHER): Payer: BC Managed Care – PPO | Admitting: Internal Medicine

## 2016-08-17 ENCOUNTER — Encounter: Payer: Self-pay | Admitting: Internal Medicine

## 2016-08-17 VITALS — BP 110/74 | HR 51 | Temp 98.2°F | Wt 164.2 lb

## 2016-08-17 DIAGNOSIS — Z23 Encounter for immunization: Secondary | ICD-10-CM

## 2016-08-17 DIAGNOSIS — F32A Depression, unspecified: Secondary | ICD-10-CM

## 2016-08-17 DIAGNOSIS — E669 Obesity, unspecified: Secondary | ICD-10-CM | POA: Diagnosis not present

## 2016-08-17 DIAGNOSIS — B3731 Acute candidiasis of vulva and vagina: Secondary | ICD-10-CM

## 2016-08-17 DIAGNOSIS — F329 Major depressive disorder, single episode, unspecified: Secondary | ICD-10-CM | POA: Diagnosis not present

## 2016-08-17 DIAGNOSIS — B373 Candidiasis of vulva and vagina: Secondary | ICD-10-CM

## 2016-08-17 LAB — HEMOGLOBIN A1C: Hgb A1c MFr Bld: 4.8 % (ref 4.6–6.5)

## 2016-08-17 MED ORDER — NALTREXONE-BUPROPION HCL ER 8-90 MG PO TB12: ORAL_TABLET | ORAL | 0 refills | Status: DC

## 2016-08-17 MED ORDER — SERTRALINE HCL 50 MG PO TABS: 50.0000 mg | ORAL_TABLET | Freq: Every day | ORAL | 1 refills | Status: DC

## 2016-08-17 NOTE — Progress Notes (Signed)
Subjective:    Patient ID: Lisa Rosario, female    DOB: [birth year], 40 y.o.   MRN: [id number]  HPI  Pt presents to the clinic today to follow up depression. She reports her depression is triggered by stress at home and at work. She was treated with Zoloft but stopped it secondary to weight gain. She was started on Prozac 04/2016 but reports she did not feel like it was effective, so she switched back to the Zoloft. She is interested in trying Contrave because she heard that can help with depression and weight loss. She had a bypass in 2009, but gained 16 lb last year. She has lost 4 lbs in the last 3 months.  She also reports recurrent vaginal yeast infections. This occurs monthly after her menses. She does not douche, use summer eves products, or bubble baths. She takes Monistat as needed with good relief.  Review of Systems      Past Medical History:  Diagnosis Date  . Allergy   . Chicken pox   . Childhood asthma     Current Outpatient Prescriptions  Medication Sig Dispense Refill  . FLUoxetine (PROZAC) 20 MG tablet Take 1 tablet (20 mg total) by mouth daily. 30 tablet 2  . glycopyrrolate (ROBINUL) 2 MG tablet Take 2 mg by mouth daily.     . sertraline (ZOLOFT) 50 MG tablet Take 1 tablet (50 mg total) by mouth at bedtime. 90 tablet 1   No current facility-administered medications for this visit.     No Known Allergies  Family History  Problem Relation Age of Onset  . Hyperlipidemia Mother   . Hypertension Mother   . Hyperlipidemia Maternal Grandmother   . Heart disease Maternal Grandmother   . Hyperlipidemia Maternal Grandfather   . Heart disease Maternal Grandfather   . Hyperlipidemia Paternal Grandmother   . Heart disease Paternal Grandmother   . Hyperlipidemia Paternal Grandfather     Social History   Social History  . Marital status: Single    Spouse name: N/A  . Number of children: N/A  . Years of education: N/A   Occupational History  . Not on file.    Social History Main Topics  . Smoking status: Never Smoker  . Smokeless tobacco: Never Used  . Alcohol use No  . Drug use: No  . Sexual activity: Yes    Birth control/ protection: IUD   Other Topics Concern  . Not on file   Social History Narrative  . No narrative on file     Constitutional: Denies fever, malaise, fatigue, headache or abrupt weight changes.  Respiratory: Denies difficulty breathing, shortness of breath, cough or sputum production.   Cardiovascular: Denies chest pain, chest tightness, palpitations or swelling in the hands or feet.  Gastrointestinal: Denies abdominal pain, bloating, constipation, diarrhea or blood in the stool.  GU: Denies urgency, frequency, pain with urination, burning sensation, blood in urine, odor or discharge. Neurological: Denies dizziness, difficulty with memory, difficulty with speech or problems with balance and coordination.  Psych: Pt has history of depression. Denies anxiety, SI/HI.  No other specific complaints in a complete review of systems (except as listed in HPI above).  Objective:   Physical Exam  BP 110/74   Pulse (!) 51   Temp 98.2 F (36.8 C) (Oral)   Wt 164 lb 4 oz (74.5 kg)   LMP 07/31/2016   SpO2 99%   BMI 31.55 kg/m  Wt Readings from Last 3 Encounters:  08/17/16 164  lb 4 oz (74.5 kg)  05/17/16 168 lb 12 oz (76.5 kg)  01/04/16 156 lb (70.8 kg)    General: Appears her stated age, obese in NAD. Cardiovascular: Normal rate and rhythm. S1,S2 noted.  No murmur, rubs or gallops noted.  Pulmonary/Chest: Normal effort and positive vesicular breath sounds. No respiratory distress. No wheezes, rales or ronchi noted.  Abdomen: Soft and nontender.  Neurological: Alert and oriented.  Psychiatric: Mood and affect normal. Behavior is normal. Judgment and thought content normal.     BMET    Component Value Date/Time   NA 137 04/26/2015 0956   K 4.2 04/26/2015 0956   CL 104 04/26/2015 0956   CO2 29 04/26/2015 0956    GLUCOSE 81 04/26/2015 0956   BUN 12 04/26/2015 0956   CREATININE 0.66 04/26/2015 0956   CALCIUM 9.2 04/26/2015 0956    Lipid Panel  No results found for: CHOL, TRIG, HDL, CHOLHDL, VLDL, LDLCALC  CBC No results found for: WBC, RBC, HGB, HCT, PLT, MCV, MCH, MCHC, RDW, LYMPHSABS, MONOABS, EOSABS, BASOSABS  Hgb A1C No results found for: HGBA1C          Assessment & Plan:   Recurrent vaginal candidiasis:  No identifying environmental factors Will check A1C today  RTC in 1 month for weight check and med refill Nicki ReaperBAITY, Kelse Ploch, NP

## 2016-08-17 NOTE — Assessment & Plan Note (Signed)
Discussed continue Zoloft for now- she agrees with this plan Zoloft refilled today

## 2016-08-17 NOTE — Assessment & Plan Note (Signed)
Encouraged her to work on diet and exercise eRx for Contrave, discussed not taking with narcotics- she understands and agrees

## 2016-08-17 NOTE — Patient Instructions (Signed)
Major Depressive Disorder Major depressive disorder is a mental illness. It also may be called clinical depression or unipolar depression. Major depressive disorder usually causes feelings of sadness, hopelessness, or helplessness. Some people with this disorder do not feel particularly sad but lose interest in doing things they used to enjoy (anhedonia). Major depressive disorder also can cause physical symptoms. It can interfere with work, school, relationships, and other normal everyday activities. The disorder varies in severity but is longer lasting and more serious than the sadness we all feel from time to time in our lives. Major depressive disorder often is triggered by stressful life events or major life changes. Examples of these triggers include divorce, loss of your job or home, a move, and the death of a family member or close friend. Sometimes this disorder occurs for no obvious reason at all. People who have family members with major depressive disorder or bipolar disorder are at higher risk for developing this disorder, with or without life stressors. Major depressive disorder can occur at any age. It may occur just once in your life (single episode major depressive disorder). It may occur multiple times (recurrent major depressive disorder). SYMPTOMS People with major depressive disorder have either anhedonia or depressed mood on nearly a daily basis for at least 2 weeks or longer. Symptoms of depressed mood include:  Feelings of sadness (blue or down in the dumps) or emptiness.  Feelings of hopelessness or helplessness.  Tearfulness or episodes of crying (may be observed by others).  Irritability (children and adolescents). In addition to depressed mood or anhedonia or both, people with this disorder have at least four of the following symptoms:  Difficulty sleeping or sleeping too much.   Significant change (increase or decrease) in appetite or weight.   Lack of energy or  motivation.  Feelings of guilt and worthlessness.   Difficulty concentrating, remembering, or making decisions.  Unusually slow movement (psychomotor retardation) or restlessness (as observed by others).   Recurrent wishes for death, recurrent thoughts of self-harm (suicide), or a suicide attempt. People with major depressive disorder commonly have persistent negative thoughts about themselves, other people, and the world. People with severe major depressive disorder may experiencedistorted beliefs or perceptions about the world (psychotic delusions). They also may see or hear things that are not real (psychotic hallucinations). DIAGNOSIS Major depressive disorder is diagnosed through an assessment by your health care provider. Your health care provider will ask aboutaspects of your daily life, such as mood,sleep, and appetite, to see if you have the diagnostic symptoms of major depressive disorder. Your health care provider may ask about your medical history and use of alcohol or drugs, including prescription medicines. Your health care provider also may do a physical exam and blood work. This is because certain medical conditions and the use of certain substances can cause major depressive disorder-like symptoms (secondary depression). Your health care provider also may refer you to a mental health specialist for further evaluation and treatment. TREATMENT It is important to recognize the symptoms of major depressive disorder and seek treatment. The following treatments can be prescribed for this disorder:   Medicine. Antidepressant medicines usually are prescribed. Antidepressant medicines are thought to correct chemical imbalances in the brain that are commonly associated with major depressive disorder. Other types of medicine may be added if the symptoms do not respond to antidepressant medicines alone or if psychotic delusions or hallucinations occur.  Talk therapy. Talk therapy can be  helpful in treating major depressive disorder by providing   support, education, and guidance. Certain types of talk therapy also can help with negative thinking (cognitive behavioral therapy) and with relationship issues that trigger this disorder (interpersonal therapy). A mental health specialist can help determine which treatment is best for you. Most people with major depressive disorder do well with a combination of medicine and talk therapy. Treatments involving electrical stimulation of the brain can be used in situations with extremely severe symptoms or when medicine and talk therapy do not work over time. These treatments include electroconvulsive therapy, transcranial magnetic stimulation, and vagal nerve stimulation.   This information is not intended to replace advice given to you by your health care provider. Make sure you discuss any questions you have with your health care provider.   Document Released: 03/02/2013 Document Revised: 11/26/2014 Document Reviewed: 03/02/2013 Elsevier Interactive Patient Education 2016 Elsevier Inc.  

## 2016-08-20 ENCOUNTER — Telehealth: Payer: Self-pay

## 2016-08-20 NOTE — Addendum Note (Signed)
Addended by: Roena MaladyEVONTENNO, Xerxes Agrusa Y on: 08/20/2016 04:51 PM   Modules accepted: Orders

## 2016-08-20 NOTE — Telephone Encounter (Signed)
PA has been submitted to [facility] through [email or web address]...awaiting response

## 2016-09-14 ENCOUNTER — Encounter: Payer: Self-pay | Admitting: Internal Medicine

## 2016-09-14 ENCOUNTER — Ambulatory Visit (INDEPENDENT_AMBULATORY_CARE_PROVIDER_SITE_OTHER): Payer: BC Managed Care – PPO | Admitting: Internal Medicine

## 2016-09-14 VITALS — BP 106/64 | HR 66 | Temp 98.7°F | Wt 162.8 lb

## 2016-09-14 DIAGNOSIS — F341 Dysthymic disorder: Secondary | ICD-10-CM

## 2016-09-14 DIAGNOSIS — R635 Abnormal weight gain: Secondary | ICD-10-CM | POA: Diagnosis not present

## 2016-09-14 MED ORDER — NALTREXONE-BUPROPION HCL ER 8-90 MG PO TB12: 2.0000 | ORAL_TABLET | Freq: Two times a day (BID) | ORAL | 1 refills | Status: DC

## 2016-09-14 NOTE — Progress Notes (Signed)
Subjective:    Patient ID: Tami Ribas, female    DOB: 01-18-1976, 40 y.o.   MRN: [id number]  HPI  Pt presents to the clinic today to follow up depression and abnormal weight gain. She was started on Contrave 9/29, but because of needing a PA, she only started taking the medication 2 weeks ago. Her starting weight was 164.25. Today's weight is 162.75, BMI of 31.26. She is still doing weight watchers, counting points. No exercise at this time. She reports her depression is about the same. She continues to take the Zoloft. She denies SI/HI.  Review of Systems      Past Medical History:  Diagnosis Date  . Allergy   . Chicken pox   . Childhood asthma     Current Outpatient Prescriptions  Medication Sig Dispense Refill  . glycopyrrolate (ROBINUL) 2 MG tablet Take 2 mg by mouth daily.     . Naltrexone-Bupropion HCl ER 8-90 MG TB12 Take 1 tab in am x 1 week. Take 1 tab in the am and the pm x 1 week. Take 2 tabs in the am and 1 tab in the pm x 1 week. Take 2 tabs in am and 2 tabs in pm thereafter. 120 tablet 0  . sertraline (ZOLOFT) 50 MG tablet Take 1 tablet (50 mg total) by mouth daily. 90 tablet 1   No current facility-administered medications for this visit.     No Known Allergies  Family History  Problem Relation Age of Onset  . Hyperlipidemia Mother   . Hypertension Mother   . Hyperlipidemia Maternal Grandmother   . Heart disease Maternal Grandmother   . Hyperlipidemia Maternal Grandfather   . Heart disease Maternal Grandfather   . Hyperlipidemia Paternal Grandmother   . Heart disease Paternal Grandmother   . Hyperlipidemia Paternal Grandfather     Social History   Social History  . Marital status: Single    Spouse name: N/A  . Number of children: N/A  . Years of education: N/A   Occupational History  . Not on file.   Social History Main Topics  . Smoking status: Never Smoker  . Smokeless tobacco: Never Used  . Alcohol use No  . Drug use: No  . Sexual  activity: Yes    Birth control/ protection: IUD   Other Topics Concern  . Not on file   Social History Narrative  . No narrative on file     Constitutional: Denies fever, malaise, fatigue, headache or abrupt weight changes.  Respiratory: Denies difficulty breathing, shortness of breath, cough or sputum production.   Cardiovascular: Denies chest pain, chest tightness, palpitations or swelling in the hands or feet.  Neurological: Denies dizziness, difficulty with memory, difficulty with speech or problems with balance and coordination.  Psych: Pt reports depression. Denies anxiety, SI/HI.  No other specific complaints in a complete review of systems (except as listed in HPI above).  Objective:   Physical Exam  BP 106/64   Pulse 66   Temp 98.7 F (37.1 C) (Oral)   Wt 162 lb 12 oz (73.8 kg)   LMP 08/30/2016   SpO2 98%   BMI 31.26 kg/m  Wt Readings from Last 3 Encounters:  09/14/16 162 lb 12 oz (73.8 kg)  08/17/16 164 lb 4 oz (74.5 kg)  05/17/16 168 lb 12 oz (76.5 kg)    General: Appears herstated age, obese in NAD. Cardiovascular: Normal rate and rhythm. S1,S2 noted.  No murmur, rubs or gallops noted.  Pulmonary/Chest:  Normal effort and positive vesicular breath sounds. No respiratory distress. No wheezes, rales or ronchi noted.   Neurological: Alert and oriented.  Psychiatric: Mood and affect mildly flat. Behavior is normal. Judgment and thought content normal.     BMET    Component Value Date/Time   NA 137 04/26/2015 0956   K 4.2 04/26/2015 0956   CL 104 04/26/2015 0956   CO2 29 04/26/2015 0956   GLUCOSE 81 04/26/2015 0956   BUN 12 04/26/2015 0956   CREATININE 0.66 04/26/2015 0956   CALCIUM 9.2 04/26/2015 0956    Lipid Panel  No results found for: CHOL, TRIG, HDL, CHOLHDL, VLDL, LDLCALC  CBC No results found for: WBC, RBC, HGB, HCT, PLT, MCV, MCH, MCHC, RDW, LYMPHSABS, MONOABS, EOSABS, BASOSABS  Hgb A1C Lab Results  Component Value Date   HGBA1C 4.8  08/17/2016       Assessment & Plan:   Depression:  Support offered today Continue Zoloft and Contrave  Abnormal weight gain:  Will give the Contrave more time to work Continue [employer]weight watchers diet Encouraged exercise Contrave refilled today  RTC in 2 months for weight check/med refill Nicki ReaperBAITY, Wilmoth Rasnic, NP

## 2016-09-14 NOTE — Patient Instructions (Signed)
Allergic Rhinitis Allergic rhinitis is when the mucous membranes in the nose respond to allergens. Allergens are particles in the air that cause your body to have an allergic reaction. This causes you to release allergic antibodies. Through a chain of events, these eventually cause you to release histamine into the blood stream. Although meant to protect the body, it is this release of histamine that causes your discomfort, such as frequent sneezing, congestion, and an itchy, runny nose.  CAUSES Seasonal allergic rhinitis (hay fever) is caused by pollen allergens that may come from grasses, trees, and weeds. Year-round allergic rhinitis (perennial allergic rhinitis) is caused by allergens such as house dust mites, pet dander, and mold spores. SYMPTOMS  Nasal stuffiness (congestion).  Itchy, runny nose with sneezing and tearing of the eyes. DIAGNOSIS Your health care provider can help you determine the allergen or allergens that trigger your symptoms. If you and your health care provider are unable to determine the allergen, skin or blood testing may be used. Your health care provider will diagnose your condition after taking your health history and performing a physical exam. Your health care provider may assess you for other related conditions, such as asthma, pink eye, or an ear infection. TREATMENT Allergic rhinitis does not have a cure, but it can be controlled by:  Medicines that block allergy symptoms. These may include allergy shots, nasal sprays, and oral antihistamines.  Avoiding the allergen. Hay fever may often be treated with antihistamines in pill or nasal spray forms. Antihistamines block the effects of histamine. There are over-the-counter medicines that may help with nasal congestion and swelling around the eyes. Check with your health care provider before taking or giving this medicine. If avoiding the allergen or the medicine prescribed do not work, there are many new medicines  your health care provider can prescribe. Stronger medicine may be used if initial measures are ineffective. Desensitizing injections can be used if medicine and avoidance does not work. Desensitization is when a patient is given ongoing shots until the body becomes less sensitive to the allergen. Make sure you follow up with your health care provider if problems continue. HOME CARE INSTRUCTIONS It is not possible to completely avoid allergens, but you can reduce your symptoms by taking steps to limit your exposure to them. It helps to know exactly what you are allergic to so that you can avoid your specific triggers. SEEK MEDICAL CARE IF:  You have a fever.  You develop a cough that does not stop easily (persistent).  You have shortness of breath.  You start wheezing.  Symptoms interfere with normal daily activities.   This information is not intended to replace advice given to you by your health care provider. Make sure you discuss any questions you have with your health care provider.   Document Released: 07/31/2001 Document Revised: 11/26/2014 Document Reviewed: 07/13/2013 Elsevier Interactive Patient Education 2016 Elsevier Inc.  

## 2016-09-17 ENCOUNTER — Encounter: Payer: Self-pay | Admitting: Internal Medicine

## 2016-11-14 ENCOUNTER — Telehealth: Payer: Self-pay | Admitting: Internal Medicine

## 2016-11-14 ENCOUNTER — Ambulatory Visit: Payer: BC Managed Care – PPO | Admitting: Internal Medicine

## 2016-11-14 DIAGNOSIS — Z0289 Encounter for other administrative examinations: Secondary | ICD-10-CM

## 2016-11-14 NOTE — Telephone Encounter (Signed)
No follow up needed

## 2016-11-14 NOTE — Progress Notes (Deleted)
   Subjective:    Patient ID: Lisa RibasJoy T Patteson, female    DOB: [birth year], 40 y.o.   MRN: [id number]  HPI  Pt presents to the clinic today for 2 month follow up for weight check and med refill. She was started on Naltrexone-Buproprion 08/15/16. Her starting weight was 164.25 with a BMI of 31.55. She has been taking the medication as directed and denies adverse effects. Her weight today is, with a BMI of.  Breakfast: Lunch: Dinner: Snacks:  Exercise:   Review of Systems      Past Medical History:  Diagnosis Date  . Allergy   . Chicken pox   . Childhood asthma     Current Outpatient Prescriptions  Medication Sig Dispense Refill  . glycopyrrolate (ROBINUL) 2 MG tablet Take 2 mg by mouth daily.     . Naltrexone-Bupropion HCl ER 8-90 MG TB12 Take 2 tablets by mouth 2 (two) times daily. Take 1 tab in am x 1 week. Take 1 tab in the am and the pm x 1 week. Take 2 tabs in the am and 1 tab in the pm x 1 week. Take 2 tabs in am and 2 tabs in pm thereafter. 120 tablet 1  . sertraline (ZOLOFT) 50 MG tablet Take 1 tablet (50 mg total) by mouth daily. 90 tablet 1   No current facility-administered medications for this visit.     No Known Allergies  Family History  Problem Relation Age of Onset  . Hyperlipidemia Mother   . Hypertension Mother   . Hyperlipidemia Maternal Grandmother   . Heart disease Maternal Grandmother   . Hyperlipidemia Maternal Grandfather   . Heart disease Maternal Grandfather   . Hyperlipidemia Paternal Grandmother   . Heart disease Paternal Grandmother   . Hyperlipidemia Paternal Grandfather     Social History   Social History  . Marital status: Single    Spouse name: N/A  . Number of children: N/A  . Years of education: N/A   Occupational History  . Not on file.   Social History Main Topics  . Smoking status: Never Smoker  . Smokeless tobacco: Never Used  . Alcohol use No  . Drug use: No  . Sexual activity: Yes    Birth control/ protection: IUD    Other Topics Concern  . Not on file   Social History Narrative  . No narrative on file     Constitutional: Denies fever, malaise, fatigue, headache or abrupt weight changes.  HEENT: Denies eye pain, eye redness, ear pain, ringing in the ears, wax buildup, runny nose, nasal congestion, bloody nose, or sore throat. Respiratory: Denies difficulty breathing, shortness of breath, cough or sputum production.   Cardiovascular: Denies chest pain, chest tightness, palpitations or swelling in the hands or feet.  Gastrointestinal: Denies abdominal pain, bloating, constipation, diarrhea or blood in the stool.  GU: Denies urgency, frequency, pain with urination, burning sensation, blood in urine, odor or discharge. Musculoskeletal: Denies decrease in range of motion, difficulty with gait, muscle pain or joint pain and swelling.  Skin: Denies redness, rashes, lesions or ulcercations.  Neurological: Denies dizziness, difficulty with memory, difficulty with speech or problems with balance and coordination.  Psych: Denies anxiety, depression, SI/HI.  No other specific complaints in a complete review of systems (except as listed in HPI above).  Objective:   Physical Exam        Assessment & Plan:

## 2016-11-14 NOTE — Telephone Encounter (Signed)
Patient did not come in for their appointment today for 2 month follow up.  Please let me know if patient needs to be contacted immediately for follow up or no follow up needed. °

## 2017-03-28 ENCOUNTER — Other Ambulatory Visit: Payer: Self-pay | Admitting: *Deleted

## 2017-03-28 MED ORDER — SERTRALINE HCL 50 MG PO TABS: 50.0000 mg | ORAL_TABLET | Freq: Every day | ORAL | 1 refills | Status: DC

## 2017-08-14 ENCOUNTER — Telehealth: Payer: Self-pay | Admitting: Internal Medicine

## 2017-08-14 NOTE — Telephone Encounter (Signed)
Patient Name: Lisa Rosario  DOB: [birth year]    Initial Comment Caller states she woke up with severe back pain, between shoulder blades, never had this before, and wondering what to do.    Nurse Assessment  Nurse: Scarlette Ar, RN, Heather Date/Time (Eastern Time): 08/14/2017 9:29:17 AM  Confirm and document reason for call. If symptomatic, describe symptoms. ---Caller states she woke up with severe back pain, between shoulder blades, never had this before, and wondering what to do.  Does the patient have any new or worsening symptoms? ---Yes  Will a triage be completed? ---Yes  Related visit to physician within the last 2 weeks? ---No  Does the PT have any chronic conditions? (i.e. diabetes, asthma, etc.) ---Yes  List chronic conditions. ---See MR  Is the patient pregnant or possibly pregnant? (Ask all females between the ages of 74-55) ---No  Is this a behavioral health or substance abuse call? ---No     Guidelines    Guideline Title Affirmed Question Affirmed Notes  Back Pain Back pain    Final Disposition User   See Physician within 24 Hours Standifer, RN, Research scientist (physical sciences)    Comments  appt with Rene Kocher tomorrow at 845 am   Referrals  REFERRED TO PCP OFFICE   Caller Disagree/Comply Comply  Caller Understands Yes  PreDisposition Call Doctor

## 2017-08-14 NOTE — Telephone Encounter (Signed)
I spoke with pt and she has no CP; pt picked up daughter this morning and weighing 80 lbs and felt upper back pain that was shooting into rt arm when picked up daughter. Now pt still has pain; pain is constant but movement makes pain worse. Pain level now is 6.pt plans on keeping appt with R Baity NP on 08/15/17 at 8:45 but if pain worsens pt will go to [facility]. FYI to Pamala Hurry NP.

## 2017-08-14 NOTE — Telephone Encounter (Signed)
Noted, agree with advise given

## 2017-08-15 ENCOUNTER — Encounter: Payer: Self-pay | Admitting: Internal Medicine

## 2017-08-15 ENCOUNTER — Ambulatory Visit (INDEPENDENT_AMBULATORY_CARE_PROVIDER_SITE_OTHER): Payer: BC Managed Care – PPO | Admitting: Internal Medicine

## 2017-08-15 VITALS — BP 110/68 | HR 55 | Temp 97.9°F | Wt 174.0 lb

## 2017-08-15 DIAGNOSIS — S29012A Strain of muscle and tendon of back wall of thorax, initial encounter: Secondary | ICD-10-CM

## 2017-08-15 DIAGNOSIS — S161XXA Strain of muscle, fascia and tendon at neck level, initial encounter: Secondary | ICD-10-CM

## 2017-08-15 MED ORDER — METHOCARBAMOL 500 MG PO TABS: 500.0000 mg | ORAL_TABLET | Freq: Three times a day (TID) | ORAL | 0 refills | Status: DC | PRN

## 2017-08-15 NOTE — Patient Instructions (Signed)

## 2017-08-15 NOTE — Progress Notes (Signed)
Subjective:    Patient ID: Lisa Rosario, female    DOB: [birth year], 41 y.o.   MRN: [id number]  HPI  Pt presents to the clinic today with c/o upper back pain. She reports this started yesterday, after she picked up her daughter who weighs approximately 80 lbs. She felt the pain immediately. She describes the pain as sharp and shooting. The pain radiates into her neck and her right arm. The pain is constant but worse with certain movements. She has tried Ibuprofen and Lidocaine patches with some relief.  Review of Systems      Past Medical History:  Diagnosis Date  . Allergy   . Chicken pox   . Childhood asthma     Current Outpatient Prescriptions  Medication Sig Dispense Refill  . glycopyrrolate (ROBINUL) 2 MG tablet Take 2 mg by mouth daily.     . Naltrexone-Bupropion HCl ER 8-90 MG TB12 Take 2 tablets by mouth 2 (two) times daily. Take 1 tab in am x 1 week. Take 1 tab in the am and the pm x 1 week. Take 2 tabs in the am and 1 tab in the pm x 1 week. Take 2 tabs in am and 2 tabs in pm thereafter. 120 tablet 1  . sertraline (ZOLOFT) 50 MG tablet Take 1 tablet (50 mg total) by mouth daily. 90 tablet 1   No current facility-administered medications for this visit.     No Known Allergies  Family History  Problem Relation Age of Onset  . Hyperlipidemia Mother   . Hypertension Mother   . Hyperlipidemia Maternal Grandmother   . Heart disease Maternal Grandmother   . Hyperlipidemia Maternal Grandfather   . Heart disease Maternal Grandfather   . Hyperlipidemia Paternal Grandmother   . Heart disease Paternal Grandmother   . Hyperlipidemia Paternal Grandfather     Social History   Social History  . Marital status: Single    Spouse name: N/A  . Number of children: N/A  . Years of education: N/A   Occupational History  . Not on file.   Social History Main Topics  . Smoking status: Never Smoker  . Smokeless tobacco: Never Used  . Alcohol use No  . Drug use: No  . Sexual  activity: Yes    Birth control/ protection: IUD   Other Topics Concern  . Not on file   Social History Narrative  . No narrative on file     Constitutional: Denies fever, malaise, fatigue, headache or abrupt weight changes.  Musculoskeletal: Pt reports back pain. Denies decrease in range of motion, difficulty with gait, or joint swelling.    No other specific complaints in a complete review of systems (except as listed in HPI above).  Objective:   Physical Exam   BP 110/68   Pulse (!) 55   Temp 97.9 F (36.6 C) (Oral)   Wt 174 lb (78.9 kg)   LMP 07/16/2017   SpO2 98%   BMI 33.42 kg/m  Wt Readings from Last 3 Encounters:  08/15/17 174 lb (78.9 kg)  09/14/16 162 lb 12 oz (73.8 kg)  08/17/16 164 lb 4 oz (74.5 kg)    General: Appears her stated age, obese in NAD. Musculoskeletal: Decreased flexion of the cervical spine. Normal extension, rotation and lateral bending. No bony tenderness noted over the spine. Pain with palpation of the right paracervical muscles and bilateral trapezius. Decreased internal rotation of the right shoulder, normal external rotation. Strength 5/5 BUE. Hand grips equal.  BMET    Component Value Date/Time   NA 137 04/26/2015 0956   K 4.2 04/26/2015 0956   CL 104 04/26/2015 0956   CO2 29 04/26/2015 0956   GLUCOSE 81 04/26/2015 0956   BUN 12 04/26/2015 0956   CREATININE 0.66 04/26/2015 0956   CALCIUM 9.2 04/26/2015 0956    Lipid Panel  No results found for: CHOL, TRIG, HDL, CHOLHDL, VLDL, LDLCALC  CBC No results found for: WBC, RBC, HGB, HCT, PLT, MCV, MCH, MCHC, RDW, LYMPHSABS, MONOABS, EOSABS, BASOSABS  Hgb A1C Lab Results  Component Value Date   HGBA1C 4.8 08/17/2016           Assessment & Plan:   Muscle Strain of Upper Back and Neck:  Advised her to continue Ibuprofen and pain relief patches Discussed heat, massage and stretching Stretching exercises given eRx for Robaxin 500 mg TID prn- sedation caution  given  Return precautions discussed, make an appt for your annual exam Nicki Reaper, NP

## 2017-09-12 ENCOUNTER — Encounter: Payer: BC Managed Care – PPO | Admitting: Internal Medicine

## 2017-09-13 ENCOUNTER — Other Ambulatory Visit: Payer: Self-pay

## 2017-09-13 ENCOUNTER — Encounter: Payer: BC Managed Care – PPO | Admitting: Internal Medicine

## 2017-09-13 MED ORDER — SERTRALINE HCL 50 MG PO TABS: 50.0000 mg | ORAL_TABLET | Freq: Every day | ORAL | 0 refills | Status: DC

## 2017-10-13 ENCOUNTER — Emergency Department
Admission: EM | Admit: 2017-10-13 | Discharge: 2017-10-13 | Disposition: A | Discharge status: Home / Self Care | Payer: BC Managed Care – PPO | Attending: Emergency Medicine | Admitting: Emergency Medicine

## 2017-10-13 ENCOUNTER — Encounter: Payer: Self-pay | Admitting: Emergency Medicine

## 2017-10-13 ENCOUNTER — Emergency Department: Payer: BC Managed Care – PPO

## 2017-10-13 ENCOUNTER — Other Ambulatory Visit: Payer: Self-pay

## 2017-10-13 DIAGNOSIS — R1012 Left upper quadrant pain: Secondary | ICD-10-CM | POA: Diagnosis not present

## 2017-10-13 DIAGNOSIS — Z79899 Other long term (current) drug therapy: Secondary | ICD-10-CM | POA: Diagnosis not present

## 2017-10-13 DIAGNOSIS — J45909 Unspecified asthma, uncomplicated: Secondary | ICD-10-CM | POA: Insufficient documentation

## 2017-10-13 LAB — URINALYSIS, COMPLETE (UACMP) WITH MICROSCOPIC
BILIRUBIN URINE: NEGATIVE
Glucose, UA: NEGATIVE mg/dL
KETONES UR: NEGATIVE mg/dL
Nitrite: POSITIVE — AB
Protein, ur: NEGATIVE mg/dL
SPECIFIC GRAVITY, URINE: 1.025 (ref 1.005–1.030)
pH: 5 (ref 5.0–8.0)

## 2017-10-13 LAB — COMPREHENSIVE METABOLIC PANEL
ALBUMIN: 3.3 g/dL — AB (ref 3.5–5.0)
ALT: 10 U/L — AB (ref 14–54)
AST: 20 U/L (ref 15–41)
Alkaline Phosphatase: 85 U/L (ref 38–126)
Anion gap: 9 (ref 5–15)
BUN: 12 mg/dL (ref 6–20)
CHLORIDE: 106 mmol/L (ref 101–111)
CO2: 24 mmol/L (ref 22–32)
CREATININE: 0.57 mg/dL (ref 0.44–1.00)
Calcium: 8.5 mg/dL — ABNORMAL LOW (ref 8.9–10.3)
GFR calc Af Amer: >60 mL/min (ref >60)
Glucose, Bld: 90 mg/dL (ref 65–99)
POTASSIUM: 3.6 mmol/L (ref 3.5–5.1)
SODIUM: 139 mmol/L (ref 135–145)
Total Bilirubin: 0.3 mg/dL (ref 0.3–1.2)
Total Protein: 7.1 g/dL (ref 6.5–8.1)

## 2017-10-13 LAB — CBC
HEMATOCRIT: 31.8 % — AB (ref 35.0–47.0)
Hemoglobin: 10 g/dL — ABNORMAL LOW (ref 12.0–16.0)
MCH: 28.3 pg (ref 26.0–34.0)
MCHC: 31.4 g/dL — ABNORMAL LOW (ref 32.0–36.0)
MCV: 90.1 fL (ref 80.0–100.0)
PLATELETS: 385 10*3/uL (ref 150–440)
RBC: 3.52 MIL/uL — ABNORMAL LOW (ref 3.80–5.20)
RDW: 13.9 % (ref 11.5–14.5)
WBC: 8.4 10*3/uL (ref 3.6–11.0)

## 2017-10-13 LAB — LIPASE, BLOOD: LIPASE: 49 U/L (ref 11–51)

## 2017-10-13 LAB — POCT PREGNANCY, URINE: PREG TEST UR: NEGATIVE

## 2017-10-13 MED ORDER — KETOROLAC TROMETHAMINE 30 MG/ML IJ SOLN
30.0000 mg | Freq: Once | INTRAMUSCULAR | Status: AC
  Administered 2017-10-13: 30 mg via INTRAVENOUS
  Filled 2017-10-13: qty 1

## 2017-10-13 MED ORDER — DEXTROSE 5 % IV SOLN
1.0000 g | Freq: Once | INTRAVENOUS | Status: AC
  Administered 2017-10-13: 1 g via INTRAVENOUS
  Filled 2017-10-13: qty 10

## 2017-10-13 MED ORDER — PANTOPRAZOLE SODIUM 40 MG PO TBEC: 40.0000 mg | DELAYED_RELEASE_TABLET | Freq: Every day | ORAL | 1 refills | Status: DC

## 2017-10-13 MED ORDER — HYDROCODONE-ACETAMINOPHEN 5-325 MG PO TABS: 1.0000 | ORAL_TABLET | ORAL | 0 refills | Status: DC | PRN

## 2017-10-13 NOTE — ED Triage Notes (Signed)
Pt arrives ambulatory to triage with c/o lower abdominal pain that radiates across the abdomen. Pt states that this pain has been on and off for a while with episodes of pain receeding until this time. Pt is in NAD.

## 2017-10-13 NOTE — Discharge Instructions (Signed)
As we discussed please call the number provided for GI medicine to arrange a follow-up appointment as soon as possible.  Please take your Protonix medication each morning.  Please take pain medication if pain recurs, as needed, as written.  If pain recurs please also use over-the-counter Maalox as written on the box.  Return to the emergency department for any increased abdominal pain, fever, or any other symptom personally concerning to yourself.

## 2017-10-13 NOTE — ED Provider Notes (Signed)
[facility] Emergency Department Provider Note  Time seen: 9:12 PM  I have reviewed the triage vital signs and the nursing notes.   HISTORY  Chief Complaint Abdominal Pain    HPI Lisa Rosario is a 41 y.o. female with no past medical history presents to the emergency department for left-sided abdominal pain.  According to the patient over the past 2 weeks this is the third episode she has experience of severe left mid abdominal pain.  States when the pain occurs it is sharp and severe 10/10 in nature and then slowly subsides.  She states it started 2 weeks ago lasted several hours and then went away happened again 3 days ago and once again today.  Patient denies any fever, states occasional nausea with a severe pain but denies any vomiting.  Denies diarrhea dysuria or known hematuria.  Patient is currently on her menstrual cycle.  Denies any discharge.  Patient states the pain was a 10/10 sharp pain in her left mid abdomen earlier today it is now down to a 6/10 per patient.  Past Medical History:  Diagnosis Date  . Allergy   . Chicken pox   . Childhood asthma     Patient Active Problem List   Diagnosis Date Noted  . Excessive sweating 05/17/2016  . Childhood asthma 04/26/2015  . Seasonal allergies 04/26/2015  . Acne 04/26/2015  . Depression 04/26/2015    Past Surgical History:  Procedure Laterality Date  . CESAREAN SECTION    . GASTRIC BYPASS  04/2008    Prior to Admission medications   Medication Sig Start Date End Date Taking? Authorizing Provider  glycopyrrolate (ROBINUL) 2 MG tablet Take 2 mg by mouth daily.  05/07/16   [provider]  methocarbamol (ROBAXIN) 500 MG tablet Take 1 tablet (500 mg total) by mouth every 8 (eight) hours as needed for muscle spasms. 08/15/17   Lorre MunroeBaity, Regina W, NP  sertraline (ZOLOFT) 50 MG tablet Take 1 tablet (50 mg total) by mouth daily. 09/13/17   Lorre MunroeBaity, Regina W, NP    No Known Allergies  Family History   Problem Relation Age of Onset  . Hyperlipidemia Mother   . Hypertension Mother   . Hyperlipidemia Maternal Grandmother   . Heart disease Maternal Grandmother   . Hyperlipidemia Maternal Grandfather   . Heart disease Maternal Grandfather   . Hyperlipidemia Paternal Grandmother   . Heart disease Paternal Grandmother   . Hyperlipidemia Paternal Grandfather     Social History Social History   Tobacco Use  . Smoking status: Never Smoker  . Smokeless tobacco: Never Used  Substance Use Topics  . Alcohol use: No    Alcohol/week: 0.0 oz  . Drug use: No    Review of Systems Constitutional: Negative for fever. Cardiovascular: Negative for chest pain. Respiratory: Negative for shortness of breath. Gastrointestinal: Left flank pain. Genitourinary: Negative for dysuria. Neurological: Negative for headache All other ROS negative  ____________________________________________   PHYSICAL EXAM:  VITAL SIGNS: ED Triage Vitals  Enc Vitals Group     BP 10/13/17 2057 97/65     Pulse Rate 10/13/17 2057 61     Resp 10/13/17 2057 18     Temp 10/13/17 2057 97.8 F (36.6 C)     Temp Source 10/13/17 2057 Oral     SpO2 10/13/17 2057 100 %     Weight 10/13/17 2100 175 lb (79.4 kg)     Height 10/13/17 2100 5' (1.524 m)     Head  Circumference --      Peak Flow --      Pain Score 10/13/17 2059 8     Pain Loc --      Pain Edu? --      Excl. in GC? --     Constitutional: Alert and oriented.  Mild distress due to abdominal pain. Eyes: Normal exam ENT   Head: Normocephalic and atraumatic.   Mouth/Throat: Mucous membranes are moist. Cardiovascular: Normal rate, regular rhythm. No murmur Respiratory: Normal respiratory effort without tachypnea nor retractions. Breath sounds are clear  Gastrointestinal: Soft, slight periumbilical to left mid abdominal tenderness.  No CVA tenderness.  No rebound or guarding.  No distention.  No right upper quadrant tenderness.  No right lower quadrant  tenderness. Musculoskeletal: Nontender with normal range of motion in all extremities.  Neurologic:  Normal speech and language. No gross focal neurologic deficits  Skin:  Skin is warm, dry and intact.  Psychiatric: Mood and affect are normal.   ____________________________________________   RADIOLOGY  IMPRESSION: 1. Hazy retroperitoneal/peripancreatic and mesenteric fat in the upper abdomen possibly representing stigmata of a pancreatitis or sclerosing mesenteritis. 2. No acute bowel obstruction or inflammation. 3. Punctate nephrolithiasis without obstructive uropathy. 4. Uncomplicated cholelithiasis.  ____________________________________________   INITIAL IMPRESSION / ASSESSMENT AND PLAN / ED COURSE  Pertinent labs & imaging results that were available during my care of the patient were reviewed by me and considered in my medical decision making (see chart for details).  Patient presents to the emergency department with left mid abdominal pain intermittent over the past 2 weeks.  Differential would include ureterolithiasis, colitis, diverticulitis, ovarian cyst, urinary tract infection/pyelonephritis.  Will obtain labs, CT renal scan to further evaluate.  Patient agreeable to this plan of care.  In reviewing the patient's records it appears she had a gastric bypass surgery approximately 9 years ago.  Patient CT scan has resulted showing haziness in the peripancreatic fat and mesenteric fat.  Differential for this would include possible pancreatitis although the patient's lipase is normal, possible sclerosing mesenteritis, in reviewing literature this appears to be extremely rare but remains a possibility.  I discussed the patient with Dr. Tonita CongWoodham of general surgery who reviewed the CT scan.  There does not appear to be a need for admission at this time or any surgical operation.  Given the patient's history of gastric bypass she is at a high risk for forming a marginal ulcer which  could explain the patient's symptoms.  Patient's pain has completely resolved in the emergency department and she is symptom-free at this time.  Given the patient's reassuring labs and resolution of discomfort I believe the patient is safe for discharge home.  However I do believe the patient will require an endoscopy to further evaluate the possibility of a marginal ulceration.  We will refer the patient to GI medicine.  I will also prescribe the patient a proton pump inhibitor as well as a short course of pain medication if her pain were to return.  I also discussed with the patient using over-the-counter liquid Maalox for pain recurs.  Provided my normal abdominal pain return precautions.  Patient agreeable to this plan of care.  ____________________________________________   FINAL CLINICAL IMPRESSION(S) / ED DIAGNOSES  Abdominal pain    Minna AntisPaduchowski, Ileana Chalupa, MD 10/13/17 2248

## 2017-10-24 ENCOUNTER — Ambulatory Visit (INDEPENDENT_AMBULATORY_CARE_PROVIDER_SITE_OTHER): Payer: BC Managed Care – PPO | Admitting: Gastroenterology

## 2017-10-24 ENCOUNTER — Encounter: Payer: Self-pay | Admitting: Gastroenterology

## 2017-10-24 VITALS — BP 124/68 | HR 53 | Ht 60.0 in | Wt 176.0 lb

## 2017-10-24 DIAGNOSIS — R1084 Generalized abdominal pain: Secondary | ICD-10-CM

## 2017-10-24 NOTE — Progress Notes (Signed)
Gastroenterology Consultation  Referring Provider:     Lorre Munroe, NP Primary Care Physician:  Lorre Munroe, NP Primary Gastroenterologist:  Dr. Servando Snare     Reason for Consultation:     Abdominal pain        HPI:   Lisa Rosario is a 41 y.o. y/o female referred for consultation & management of Abdominal pain by Dr. Sampson Si, Salvadore Oxford, NP.  This patient comes in today after being seen in emergency room for abdominal pain.  The patient states that she started having abdominal pain about a month ago.  The patient states the pain is a crampy pain in both sides of her abdomen across her mid section.  The patient had a CT scan of the abdomen that showed some abnormalities. It was reported that there was some hazy retroperitoneal/peripancreatic and mesenteric fat in the upper abdomen possibly representing pancreatitis or sclerosing mesenteritis.  There is also gallstones and kidney stones without any sign of complication from these.  The patient states that food does not make the pain any better or worse.  She also denies any change in bowel habits.  She does not have any relief from moving her bowels.  She reports that there are no associations with any GI functions that precipitate or relieve her abdominal pain.  She does report that she started working out with a trainer around the same time the abdominal pain started.  She also states that she has not lost any weight.  She is status post gastric bypass surgery.  There is no report of any fevers or chills black stools or bloody stools.  She also states that she has not had any pain for the last 6 days which is the longest she has gone without pain.  Past Medical History:  Diagnosis Date  . Allergy   . Chicken pox   . Childhood asthma     Past Surgical History:  Procedure Laterality Date  . CESAREAN SECTION    . GASTRIC BYPASS  04/2008    Prior to Admission medications   Medication Sig Start Date End Date Taking? Authorizing Provider    glycopyrrolate (ROBINUL) 2 MG tablet Take 2 mg by mouth daily.  05/07/16  Yes [provider]  HYDROcodone-acetaminophen (NORCO/VICODIN) 5-325 MG tablet Take 1 tablet by mouth every 4 (four) hours as needed. 10/13/17  Yes Minna Antis, MD  methocarbamol (ROBAXIN) 500 MG tablet Take 1 tablet (500 mg total) by mouth every 8 (eight) hours as needed for muscle spasms. 08/15/17  Yes Lorre Munroe, NP  pantoprazole (PROTONIX) 40 MG tablet Take 1 tablet (40 mg total) by mouth daily. 10/13/17 10/13/18 Yes Minna Antis, MD  sertraline (ZOLOFT) 50 MG tablet Take 1 tablet (50 mg total) by mouth daily. 09/13/17  Yes Lorre Munroe, NP    Family History  Problem Relation Age of Onset  . Hyperlipidemia Mother   . Hypertension Mother   . Hyperlipidemia Maternal Grandmother   . Heart disease Maternal Grandmother   . Hyperlipidemia Maternal Grandfather   . Heart disease Maternal Grandfather   . Hyperlipidemia Paternal Grandmother   . Heart disease Paternal Grandmother   . Hyperlipidemia Paternal Grandfather      Social History   Tobacco Use  . Smoking status: Never Smoker  . Smokeless tobacco: Never Used  Substance Use Topics  . Alcohol use: No    Alcohol/week: 0.0 oz  . Drug use: No    Allergies as of 10/24/2017  . (  No Known Allergies)    Review of Systems:    All systems reviewed and negative except where noted in HPI.   Physical Exam:  BP 124/68 (BP Location: Left Arm, Patient Position: Sitting, Cuff Size: Large)   Pulse (!) 53   Ht 5' (1.524 m)   Wt 176 lb (79.8 kg)   LMP 10/13/2017 (Exact Date) Comment: neg preg test  BMI 34.37 kg/m  Patient's last menstrual period was 10/13/2017 (exact date). Psych:  Alert and cooperative. Normal mood and affect. General:   Alert,  Well-developed, well-nourished, pleasant and cooperative in NAD Head:  Normocephalic and atraumatic. Eyes:  Sclera clear, no icterus.   Conjunctiva pink. Ears:  Normal auditory acuity. Nose:   No deformity, discharge, or lesions. Mouth:  No deformity or lesions,oropharynx pink & moist. Neck:  Supple; no masses or thyromegaly. Lungs:  Respirations even and unlabored.  Clear throughout to auscultation.   No wheezes, crackles, or rhonchi. No acute distress. Heart:  Regular rate and rhythm; no murmurs, clicks, rubs, or gallops. Abdomen:  Normal bowel sounds.  No bruits.  Soft, non-tender and non-distended without masses, hepatosplenomegaly or hernias noted.  No guarding or rebound tenderness.  Negative Carnett sign.   Rectal:  Deferred.  Msk:  Symmetrical without gross deformities.  Good, equal movement & strength bilaterally. Pulses:  Normal pulses noted. Extremities:  No clubbing or edema.  No cyanosis. Neurologic:  Alert and oriented x3;  grossly normal neurologically. Skin:  Intact without significant lesions or rashes.  No jaundice. Lymph Nodes:  No significant cervical adenopathy. Psych:  Alert and cooperative. Normal mood and affect.  Imaging Studies: Ct Renal Stone Study  Result Date: 10/13/2017 CLINICAL DATA:  Lower abdominal pain radiating across the abdomen on and off. History of gastric bypass 10 years ago. EXAM: CT ABDOMEN AND PELVIS WITHOUT CONTRAST TECHNIQUE: Multidetector CT imaging of the abdomen and pelvis was performed following the standard protocol without IV contrast. COMPARISON:  None. FINDINGS: LOWER CHEST: Lung bases are clear. Included heart size is normal. No pericardial effusion. HEPATOBILIARY: Liver is unremarkable for this noncontrast exam. The gallbladder is contracted and contains numerous gallstones. No biliary dilatation is noted. PANCREAS: There is mild retroperitoneal and peripancreatic mesenteric edema, nonspecific. Correlate to exclude a pancreatitis. Findings could be secondary to a sclerosing mesenteritis given presence of small mesenteric lymph nodes within the hazy mesentery. SPLEEN: Normal. ADRENALS/URINARY TRACT: Kidneys are orthotopic. Punctate  calcifications are seen within both kidneys likely reflecting nonobstructing renal calculi or vascular calcifications. No apparent mass, hydronephrosis nor hydroureter. The unopacified ureters are normal in course and caliber. Urinary bladder is partially distended and unremarkable. Normal adrenal glands. STOMACH/BOWEL: The patient is status post bariatric surgery. The small and large bowel are normal in course and caliber without inflammatory changes. Normal appendix. VASCULAR/LYMPHATIC: Aortoiliac vessels are normal in course and caliber. No lymphadenopathy by CT size criteria. REPRODUCTIVE: Normal.  Menstrual cup in place. OTHER: No intraperitoneal free fluid or free air. MUSCULOSKELETAL: Nonacute. IMPRESSION: 1. Hazy retroperitoneal/peripancreatic and mesenteric fat in the upper abdomen possibly representing stigmata of a pancreatitis or sclerosing mesenteritis. 2. No acute bowel obstruction or inflammation. 3. Punctate nephrolithiasis without obstructive uropathy. 4. Uncomplicated cholelithiasis. Electronically Signed   By: Tollie Ethavid  Kwon M.D.   On: 10/13/2017 22:18    Assessment and Plan:   Tami RibasJoy T Lerette is a 41 y.o. y/o female who comes in today after having been in the emergency room.  The patient had abdominal pain that is both on the right  side and left side of her abdomen.  Her CT scan showed some possible inflammation around the pancreas but her pancreatic lipase was normal.  The patient has not had any pain for the last 6 days.  Is also no association of the pain with any GI function such as eating drinking or moving her bowels. The pain is not reproducible at this time.  The patient has been told that the pain may not be GI related and may be musculoskeletal since she had started working out with a personal trainer just about the time the pain started.  The patient has been told to try an elicit the pain by flexing her abdominal wall muscles the next time the pain occurs. The patient has been  explained the plan and agrees with it.  Midge Miniumarren Almarosa Bohac, MD. Clementeen GrahamFACG   Note: This dictation was prepared with Dragon dictation along with smaller phrase technology. Any transcriptional errors that result from this process are unintentional.

## 2017-12-19 ENCOUNTER — Other Ambulatory Visit: Payer: Self-pay | Admitting: Internal Medicine

## 2018-03-18 ENCOUNTER — Other Ambulatory Visit: Payer: Self-pay | Admitting: Internal Medicine

## 2018-04-20 ENCOUNTER — Other Ambulatory Visit: Payer: Self-pay | Admitting: Internal Medicine

## 2018-05-26 ENCOUNTER — Encounter: Payer: BC Managed Care – PPO | Admitting: Internal Medicine

## 2018-05-26 ENCOUNTER — Telehealth: Payer: Self-pay | Admitting: Internal Medicine

## 2018-05-26 MED ORDER — SERTRALINE HCL 50 MG PO TABS: ORAL_TABLET | ORAL | 1 refills | Status: DC

## 2018-05-26 NOTE — Telephone Encounter (Signed)
Pt came in late for appt, so we rescheduled for next available physical. Can she have refill on prescriptions to last until physical in August?

## 2018-05-26 NOTE — Telephone Encounter (Signed)
Rx sent through [facility], absolutely no more refills, must keep upcoming appt

## 2018-07-15 ENCOUNTER — Ambulatory Visit (INDEPENDENT_AMBULATORY_CARE_PROVIDER_SITE_OTHER): Payer: BC Managed Care – PPO | Admitting: Internal Medicine

## 2018-07-15 ENCOUNTER — Encounter: Payer: Self-pay | Admitting: Internal Medicine

## 2018-07-15 VITALS — BP 112/74 | HR 58 | Temp 98.4°F | Ht 60.25 in | Wt 179.0 lb

## 2018-07-15 DIAGNOSIS — F341 Dysthymic disorder: Secondary | ICD-10-CM

## 2018-07-15 DIAGNOSIS — K219 Gastro-esophageal reflux disease without esophagitis: Secondary | ICD-10-CM

## 2018-07-15 DIAGNOSIS — R61 Generalized hyperhidrosis: Secondary | ICD-10-CM | POA: Diagnosis not present

## 2018-07-15 DIAGNOSIS — Z0000 Encounter for general adult medical examination without abnormal findings: Secondary | ICD-10-CM

## 2018-07-15 MED ORDER — SERTRALINE HCL 50 MG PO TABS: ORAL_TABLET | ORAL | 0 refills | Status: DC

## 2018-07-15 NOTE — Assessment & Plan Note (Signed)
Will restart Zoloft Support offered today

## 2018-07-15 NOTE — Patient Instructions (Signed)

## 2018-07-15 NOTE — Assessment & Plan Note (Signed)
Resolved  Will monitor

## 2018-07-15 NOTE — Assessment & Plan Note (Signed)
Continue Glycopyrrolate  

## 2018-07-15 NOTE — Progress Notes (Signed)
Subjective:    Patient ID: Lisa Rosario, female    DOB: [birth year], 42 y.o.   MRN: [id number]  HPI  Pt presents to the clinic today for her annual exam.   GERD: Currently not an issue. She is no longer taking her PPI.  Depression: Worse over the last 2 weeks because she has been out of her medication. She would like this refilled today. She denies SI/HI.  Excessive Sweating: Controlled on Glycopyrrolate.  Flu: 08/2016 Tetanus: 04/2011 Pap Smear: 11/2014 Mammogram: 12/2013 Vision Screening: yearly Dentist: biannually  Diet: She does eat meat. She consumes fruits and veggies daily. She does eat some fried foods. She drinks mostly water and juice. Exercise: None  Review of Systems      Past Medical History:  Diagnosis Date  . Allergy   . Chicken pox   . Childhood asthma     Current Outpatient Medications  Medication Sig Dispense Refill  . glycopyrrolate (ROBINUL) 2 MG tablet Take 2 mg by mouth daily.     Marland Kitchen HYDROcodone-acetaminophen (NORCO/VICODIN) 5-325 MG tablet Take 1 tablet by mouth every 4 (four) hours as needed. 15 tablet 0  . methocarbamol (ROBAXIN) 500 MG tablet Take 1 tablet (500 mg total) by mouth every 8 (eight) hours as needed for muscle spasms. 10 tablet 0  . pantoprazole (PROTONIX) 40 MG tablet Take 1 tablet (40 mg total) by mouth daily. 30 tablet 1  . sertraline (ZOLOFT) 50 MG tablet TAKE 1 TABLET BY MOUTH DAILY. MUST SCHEDULE ANNUAL PHYSICAL FOR REFILLS. 30 tablet 1   No current facility-administered medications for this visit.     No Known Allergies  Family History  Problem Relation Age of Onset  . Hyperlipidemia Mother   . Hypertension Mother   . Hyperlipidemia Maternal Grandmother   . Heart disease Maternal Grandmother   . Hyperlipidemia Maternal Grandfather   . Heart disease Maternal Grandfather   . Hyperlipidemia Paternal Grandmother   . Heart disease Paternal Grandmother   . Hyperlipidemia Paternal Grandfather     Social History    Socioeconomic History  . Marital status: Single    Spouse name: Not on file  . Number of children: Not on file  . Years of education: Not on file  . Highest education level: Not on file  Occupational History  . Not on file  Social Needs  . Financial resource strain: Not on file  . Food insecurity:    Worry: Not on file    Inability: Not on file  . Transportation needs:    Medical: Not on file    Non-medical: Not on file  Tobacco Use  . Smoking status: Never Smoker  . Smokeless tobacco: Never Used  Substance and Sexual Activity  . Alcohol use: No    Alcohol/week: 0.0 standard drinks  . Drug use: No  . Sexual activity: Yes    Birth control/protection: IUD  Lifestyle  . Physical activity:    Days per week: Not on file    Minutes per session: Not on file  . Stress: Not on file  Relationships  . Social connections:    Talks on phone: Not on file    Gets together: Not on file    Attends religious service: Not on file    Active member of club or organization: Not on file    Attends meetings of clubs or organizations: Not on file    Relationship status: Not on file  . Intimate partner violence:    Fear of  current or ex partner: Not on file    Emotionally abused: Not on file    Physically abused: Not on file    Forced sexual activity: Not on file  Other Topics Concern  . Not on file  Social History Narrative  . Not on file     Constitutional: Denies fever, malaise, fatigue, headache or abrupt weight changes.  HEENT: Denies eye pain, eye redness, ear pain, ringing in the ears, wax buildup, runny nose, nasal congestion, bloody nose, or sore throat. Respiratory: Denies difficulty breathing, shortness of breath, cough or sputum production.   Cardiovascular: Denies chest pain, chest tightness, palpitations or swelling in the hands or feet.  Gastrointestinal: Denies abdominal pain, bloating, constipation, diarrhea or blood in the stool.  GU: Denies urgency, frequency, pain  with urination, burning sensation, blood in urine, odor or discharge. Musculoskeletal: Denies decrease in range of motion, difficulty with gait, muscle pain or joint pain and swelling.  Skin: Denies redness, rashes, lesions or ulcercations.  Neurological: Denies dizziness, difficulty with memory, difficulty with speech or problems with balance and coordination.  Psych: Pt reports depression. Denies anxiety, SI/HI.  No other specific complaints in a complete review of systems (except as listed in HPI above).  Objective:   Physical Exam   BP 112/74   Pulse (!) 58   Temp 98.4 F (36.9 C) (Oral)   Ht 5' 0.25" (1.53 m)   Wt 179 lb (81.2 kg)   LMP 06/17/2018   SpO2 98%   BMI 34.67 kg/m  Wt Readings from Last 3 Encounters:  07/15/18 179 lb (81.2 kg)  10/24/17 176 lb (79.8 kg)  10/13/17 175 lb (79.4 kg)    General: Appears her stated age, obese in NAD. Skin: Warm, dry and intact.  HEENT: Head: normal shape and size; Eyes: sclera white, no icterus, conjunctiva pink, PERRLA and EOMs intact; Ears: Tm's gray and intact, normal light reflex;  Throat/Mouth: Teeth present, mucosa pink and moist, no exudate, lesions or ulcerations noted.  Neck:  Neck supple, trachea midline. No masses, lumps or thyromegaly present.  Cardiovascular: Bradycardic with rhythm. S1,S2 noted.  No murmur, rubs or gallops noted. No JVD or BLE edema.  Pulmonary/Chest: Normal effort and positive vesicular breath sounds. No respiratory distress. No wheezes, rales or ronchi noted.  Abdomen: Soft and nontender. Normal bowel sounds. No distention or masses noted. Liver, spleen and kidneys non palpable. Musculoskeletal: Strength 5/5 BUE/BLE. No difficulty with gait.  Neurological: Alert and oriented. Cranial nerves II-XII grossly intact. Coordination normal.  Psychiatric: Mood and affect normal. Behavior is normal. Judgment and thought content normal.     BMET    Component Value Date/Time   NA 139 10/13/2017 2102   K  3.6 10/13/2017 2102   CL 106 10/13/2017 2102   CO2 24 10/13/2017 2102   GLUCOSE 90 10/13/2017 2102   BUN 12 10/13/2017 2102   CREATININE 0.57 10/13/2017 2102   CALCIUM 8.5 (L) 10/13/2017 2102   GFRNONAA >60 10/13/2017 2102   GFRAA >60 10/13/2017 2102    Lipid Panel  No results found for: CHOL, TRIG, HDL, CHOLHDL, VLDL, LDLCALC  CBC    Component Value Date/Time   WBC 8.4 10/13/2017 2102   RBC 3.52 (L) 10/13/2017 2102   HGB 10.0 (L) 10/13/2017 2102   HCT 31.8 (L) 10/13/2017 2102   PLT 385 10/13/2017 2102   MCV 90.1 10/13/2017 2102   MCH 28.3 10/13/2017 2102   MCHC 31.4 (L) 10/13/2017 2102   RDW 13.9 10/13/2017 2102  Hgb A1C Lab Results  Component Value Date   HGBA1C 4.8 08/17/2016           Assessment & Plan:   Preventative Health Maintenance:  Encouraged her to get a flu shot in the fall Tetanus UTD Pap smear UTD Discussed starting mammograms at 47 Encouraged her to consume a balanced diet and exercise regimen Advised her to see an eye doctor and dentist annually Will chec kCBC< CMET, Lipid, TSH, A1C and Vit D today  RTC in 1 year, sooner if needed Nicki Reaperegina Katalin Colledge, NP

## 2018-07-16 ENCOUNTER — Other Ambulatory Visit: Payer: Self-pay | Admitting: Internal Medicine

## 2018-07-16 DIAGNOSIS — E559 Vitamin D deficiency, unspecified: Secondary | ICD-10-CM

## 2018-07-16 LAB — COMPREHENSIVE METABOLIC PANEL
ALT: 5 U/L (ref 0–35)
AST: 12 U/L (ref 0–37)
Albumin: 3.7 g/dL (ref 3.5–5.2)
Alkaline Phosphatase: 89 U/L (ref 39–117)
BUN: 8 mg/dL (ref 6–23)
CO2: 25 mEq/L (ref 19–32)
Calcium: 8.7 mg/dL (ref 8.4–10.5)
Chloride: 106 mEq/L (ref 96–112)
Creatinine, Ser: 0.78 mg/dL (ref 0.40–1.20)
GFR: 104.92 mL/min (ref >60.00)
Glucose, Bld: 89 mg/dL (ref 70–99)
Potassium: 3.4 mEq/L — ABNORMAL LOW (ref 3.5–5.1)
Sodium: 139 mEq/L (ref 135–145)
Total Bilirubin: 0.3 mg/dL (ref 0.2–1.2)
Total Protein: 7.5 g/dL (ref 6.0–8.3)

## 2018-07-16 LAB — LIPID PANEL
Cholesterol: 171 mg/dL (ref 0–200)
HDL: 69.5 mg/dL (ref >39.00)
LDL Cholesterol: 90 mg/dL (ref 0–99)
NonHDL: 101.59
Total CHOL/HDL Ratio: 2
Triglycerides: 59 mg/dL (ref 0.0–149.0)
VLDL: 11.8 mg/dL (ref 0.0–40.0)

## 2018-07-16 LAB — CBC
HCT: 30.9 % — ABNORMAL LOW (ref 36.0–46.0)
Hemoglobin: 9.9 g/dL — ABNORMAL LOW (ref 12.0–15.0)
MCHC: 32.1 g/dL (ref 30.0–36.0)
MCV: 86.1 fl (ref 78.0–100.0)
Platelets: 389 10*3/uL (ref 150.0–400.0)
RBC: 3.59 Mil/uL — ABNORMAL LOW (ref 3.87–5.11)
RDW: 14.8 % (ref 11.5–15.5)
WBC: 9.7 10*3/uL (ref 4.0–10.5)

## 2018-07-16 LAB — VITAMIN D 25 HYDROXY (VIT D DEFICIENCY, FRACTURES): VITD: 11.6 ng/mL — ABNORMAL LOW (ref 30.00–100.00)

## 2018-07-16 LAB — TSH: TSH: 0.93 u[IU]/mL (ref 0.35–4.50)

## 2018-07-16 LAB — HEMOGLOBIN A1C: Hgb A1c MFr Bld: 5 % (ref 4.6–6.5)

## 2018-07-16 MED ORDER — VITAMIN D (ERGOCALCIFEROL) 1.25 MG (50000 UNIT) PO CAPS: 50000.0000 [IU] | ORAL_CAPSULE | ORAL | 0 refills | Status: DC

## 2018-07-16 NOTE — Progress Notes (Signed)
er

## 2018-07-17 ENCOUNTER — Encounter: Payer: Self-pay | Admitting: Internal Medicine

## 2018-07-17 DIAGNOSIS — J039 Acute tonsillitis, unspecified: Secondary | ICD-10-CM

## 2018-08-06 ENCOUNTER — Other Ambulatory Visit: Payer: Self-pay | Admitting: Otolaryngology

## 2018-08-07 ENCOUNTER — Other Ambulatory Visit: Payer: Self-pay

## 2018-08-07 ENCOUNTER — Encounter (HOSPITAL_BASED_OUTPATIENT_CLINIC_OR_DEPARTMENT_OTHER): Payer: Self-pay | Admitting: *Deleted

## 2018-08-08 ENCOUNTER — Ambulatory Visit (HOSPITAL_BASED_OUTPATIENT_CLINIC_OR_DEPARTMENT_OTHER): Payer: BC Managed Care – PPO | Admitting: Certified Registered"

## 2018-08-08 ENCOUNTER — Ambulatory Visit (HOSPITAL_BASED_OUTPATIENT_CLINIC_OR_DEPARTMENT_OTHER)
Admission: RE | Admit: 2018-08-08 | Discharge: 2018-08-08 | Disposition: A | Discharge status: Home / Self Care | Payer: BC Managed Care – PPO | Source: Ambulatory Visit | Attending: Otolaryngology | Admitting: Otolaryngology

## 2018-08-08 ENCOUNTER — Other Ambulatory Visit: Payer: Self-pay

## 2018-08-08 ENCOUNTER — Encounter (HOSPITAL_BASED_OUTPATIENT_CLINIC_OR_DEPARTMENT_OTHER): Payer: Self-pay | Admitting: Certified Registered"

## 2018-08-08 ENCOUNTER — Encounter (HOSPITAL_BASED_OUTPATIENT_CLINIC_OR_DEPARTMENT_OTHER)
Admission: RE | Disposition: A | Discharge status: Home / Self Care | Payer: Self-pay | Source: Ambulatory Visit | Attending: Otolaryngology

## 2018-08-08 DIAGNOSIS — Z9884 Bariatric surgery status: Secondary | ICD-10-CM | POA: Insufficient documentation

## 2018-08-08 DIAGNOSIS — J358 Other chronic diseases of tonsils and adenoids: Secondary | ICD-10-CM | POA: Diagnosis present

## 2018-08-08 DIAGNOSIS — F329 Major depressive disorder, single episode, unspecified: Secondary | ICD-10-CM | POA: Diagnosis not present

## 2018-08-08 DIAGNOSIS — K219 Gastro-esophageal reflux disease without esophagitis: Secondary | ICD-10-CM | POA: Diagnosis not present

## 2018-08-08 DIAGNOSIS — I739 Peripheral vascular disease, unspecified: Secondary | ICD-10-CM | POA: Insufficient documentation

## 2018-08-08 HISTORY — DX: Major depressive disorder, single episode, unspecified: F32.9

## 2018-08-08 HISTORY — PX: TONSILLECTOMY: SHX5217

## 2018-08-08 HISTORY — DX: Depression, unspecified: F32.A

## 2018-08-08 LAB — POCT PREGNANCY, URINE: Preg Test, Ur: NEGATIVE

## 2018-08-08 SURGERY — TONSILLECTOMY
Anesthesia: General | Site: Mouth | Laterality: Bilateral

## 2018-08-08 MED ORDER — LACTATED RINGERS IV SOLN
INTRAVENOUS | Status: DC
  Administered 2018-08-08 (×2): via INTRAVENOUS

## 2018-08-08 MED ORDER — MIDAZOLAM HCL 2 MG/2ML IJ SOLN
INTRAMUSCULAR | Status: AC
  Filled 2018-08-08: qty 2

## 2018-08-08 MED ORDER — HYDROCODONE-ACETAMINOPHEN 7.5-325 MG/15ML PO SOLN
ORAL | Status: AC
  Filled 2018-08-08: qty 15

## 2018-08-08 MED ORDER — MORPHINE SULFATE (PF) 4 MG/ML IV SOLN: 2.0000 mg | INTRAVENOUS | Status: DC | PRN

## 2018-08-08 MED ORDER — SCOPOLAMINE 1 MG/3DAYS TD PT72: 1.0000 | MEDICATED_PATCH | Freq: Once | TRANSDERMAL | Status: DC | PRN

## 2018-08-08 MED ORDER — SUCCINYLCHOLINE CHLORIDE 20 MG/ML IJ SOLN
INTRAMUSCULAR | Status: DC | PRN
  Administered 2018-08-08: 100 mg via INTRAVENOUS

## 2018-08-08 MED ORDER — PROPOFOL 10 MG/ML IV BOLUS
INTRAVENOUS | Status: DC | PRN
  Administered 2018-08-08: 150 mg via INTRAVENOUS

## 2018-08-08 MED ORDER — HYDROMORPHONE HCL 1 MG/ML IJ SOLN
0.2500 mg | INTRAMUSCULAR | Status: DC | PRN
  Administered 2018-08-08: 0.5 mg via INTRAVENOUS

## 2018-08-08 MED ORDER — SERTRALINE HCL 50 MG PO TABS: 50.0000 mg | ORAL_TABLET | Freq: Every day | ORAL | Status: DC

## 2018-08-08 MED ORDER — DEXAMETHASONE SODIUM PHOSPHATE 4 MG/ML IJ SOLN
INTRAMUSCULAR | Status: DC | PRN
  Administered 2018-08-08: 10 mg via INTRAVENOUS

## 2018-08-08 MED ORDER — LIDOCAINE HCL (CARDIAC) PF 100 MG/5ML IV SOSY
PREFILLED_SYRINGE | INTRAVENOUS | Status: DC | PRN
  Administered 2018-08-08: 60 mg via INTRAVENOUS

## 2018-08-08 MED ORDER — KCL IN DEXTROSE-NACL 20-5-0.45 MEQ/L-%-% IV SOLN: INTRAVENOUS | Status: DC

## 2018-08-08 MED ORDER — ROCURONIUM BROMIDE 100 MG/10ML IV SOLN
INTRAVENOUS | Status: DC | PRN
  Administered 2018-08-08: 20 mg via INTRAVENOUS

## 2018-08-08 MED ORDER — HYDROMORPHONE HCL 1 MG/ML IJ SOLN
INTRAMUSCULAR | Status: AC
  Filled 2018-08-08: qty 0.5

## 2018-08-08 MED ORDER — FENTANYL CITRATE (PF) 100 MCG/2ML IJ SOLN
50.0000 ug | INTRAMUSCULAR | Status: DC | PRN
  Administered 2018-08-08: 100 ug via INTRAVENOUS

## 2018-08-08 MED ORDER — BACITRACIN ZINC 500 UNIT/GM EX OINT
TOPICAL_OINTMENT | CUTANEOUS | Status: AC
  Filled 2018-08-08: qty 28.35

## 2018-08-08 MED ORDER — OXYMETAZOLINE HCL 0.05 % NA SOLN
NASAL | Status: AC
  Filled 2018-08-08: qty 15

## 2018-08-08 MED ORDER — HYDROCODONE-ACETAMINOPHEN 7.5-325 MG/15ML PO SOLN: 15.0000 mL | ORAL | 0 refills | Status: DC | PRN

## 2018-08-08 MED ORDER — MIDAZOLAM HCL 2 MG/2ML IJ SOLN: 1.0000 mg | INTRAMUSCULAR | Status: DC | PRN

## 2018-08-08 MED ORDER — HYDROCODONE-ACETAMINOPHEN 7.5-325 MG/15ML PO SOLN
10.0000 mL | ORAL | Status: DC | PRN
  Administered 2018-08-08: 15 mL via ORAL

## 2018-08-08 MED ORDER — ONDANSETRON HCL 4 MG/2ML IJ SOLN
INTRAMUSCULAR | Status: DC | PRN
  Administered 2018-08-08: 4 mg via INTRAVENOUS

## 2018-08-08 MED ORDER — GLYCOPYRROLATE 1 MG PO TABS: 2.0000 mg | ORAL_TABLET | Freq: Two times a day (BID) | ORAL | Status: DC

## 2018-08-08 MED ORDER — SUGAMMADEX SODIUM 200 MG/2ML IV SOLN
INTRAVENOUS | Status: DC | PRN
  Administered 2018-08-08: 300 mg via INTRAVENOUS

## 2018-08-08 MED ORDER — FENTANYL CITRATE (PF) 100 MCG/2ML IJ SOLN
INTRAMUSCULAR | Status: AC
  Filled 2018-08-08: qty 2

## 2018-08-08 SURGICAL SUPPLY — 31 items
CANISTER SUCT 1200ML W/VALVE (MISCELLANEOUS) ×3 IMPLANT
CATH ROBINSON RED A/P 10FR (CATHETERS) IMPLANT
CATH ROBINSON RED A/P 14FR (CATHETERS) IMPLANT
CLEANER CAUTERY TIP 5X5 PAD (MISCELLANEOUS) IMPLANT
COAGULATOR SUCT 6 FR SWTCH (ELECTROSURGICAL) ×1
COAGULATOR SUCT SWTCH 10FR 6 (ELECTROSURGICAL) ×2 IMPLANT
COVER BACK TABLE 60X90IN (DRAPES) ×3 IMPLANT
COVER MAYO STAND STRL (DRAPES) ×3 IMPLANT
ELECT COATED BLADE 2.86 ST (ELECTRODE) ×3 IMPLANT
ELECT REM PT RETURN 9FT ADLT (ELECTROSURGICAL) ×3
ELECT REM PT RETURN 9FT PED (ELECTROSURGICAL)
ELECTRODE REM PT RETRN 9FT PED (ELECTROSURGICAL) IMPLANT
ELECTRODE REM PT RTRN 9FT ADLT (ELECTROSURGICAL) IMPLANT
GAUZE SPONGE 4X4 12PLY STRL LF (GAUZE/BANDAGES/DRESSINGS) ×3 IMPLANT
GLOVE BIO SURGEON STRL SZ 6.5 (GLOVE) ×1 IMPLANT
GLOVE BIO SURGEON STRL SZ7.5 (GLOVE) ×3 IMPLANT
GLOVE BIO SURGEONS STRL SZ 6.5 (GLOVE) ×1
GOWN STRL REUS W/ TWL LRG LVL3 (GOWN DISPOSABLE) ×2 IMPLANT
GOWN STRL REUS W/TWL LRG LVL3 (GOWN DISPOSABLE) ×6
NS IRRIG 1000ML POUR BTL (IV SOLUTION) ×3 IMPLANT
PAD CLEANER CAUTERY TIP 5X5 (MISCELLANEOUS)
PENCIL FOOT CONTROL (ELECTRODE) ×3 IMPLANT
SHEET MEDIUM DRAPE 40X70 STRL (DRAPES) ×3 IMPLANT
SOLUTION BUTLER CLEAR DIP (MISCELLANEOUS) ×3 IMPLANT
SPONGE TONSIL TAPE 1 RFD (DISPOSABLE) IMPLANT
SPONGE TONSIL TAPE 1.25 RFD (DISPOSABLE) IMPLANT
SYR BULB 3OZ (MISCELLANEOUS) ×3 IMPLANT
TOWEL GREEN STERILE FF (TOWEL DISPOSABLE) ×3 IMPLANT
TUBE CONNECTING 20'X1/4 (TUBING) ×1
TUBE CONNECTING 20X1/4 (TUBING) ×2 IMPLANT
TUBE SALEM SUMP 16 FR W/ARV (TUBING) ×3 IMPLANT

## 2018-08-08 NOTE — Discharge Instructions (Signed)

## 2018-08-08 NOTE — Anesthesia Postprocedure Evaluation (Signed)
Anesthesia Post Note  Patient: Lisa Rosario  Procedure(s) Performed: TONSILLECTOMY (Bilateral Mouth)     Patient location during evaluation: PACU Anesthesia Type: General Level of consciousness: awake Pain management: pain level controlled Vital Signs Assessment: post-procedure vital signs reviewed and stable Respiratory status: spontaneous breathing Cardiovascular status: stable Postop Assessment: no apparent nausea or vomiting Anesthetic complications: no    Last Vitals:  Vitals:   08/08/18 1215 08/08/18 1230  BP:    Pulse: 61 63  Resp: 16 16  Temp: 36.5 C   SpO2: 98% 98%    Last Pain:  Vitals:   08/08/18 1215  TempSrc:   PainSc: 4                  Carrisa Keller

## 2018-08-08 NOTE — H&P (Signed)
Lisa Rosario is an 42 y.o. female.   Chief Complaint: Tonsil stones HPI: 42 year old female with long history of recurring tonsil stones and presents for surgical management.  Past Medical History:  Diagnosis Date  . Allergy   . Chicken pox   . Childhood asthma   . Depression     Past Surgical History:  Procedure Laterality Date  . CESAREAN SECTION    . GASTRIC BYPASS  04/2008    Family History  Problem Relation Age of Onset  . Hyperlipidemia Mother   . Hypertension Mother   . Hyperlipidemia Maternal Grandmother   . Heart disease Maternal Grandmother   . Hyperlipidemia Maternal Grandfather   . Heart disease Maternal Grandfather   . Hyperlipidemia Paternal Grandmother   . Heart disease Paternal Grandmother   . Hyperlipidemia Paternal Grandfather    Social History:  reports that she has never smoked. She has never used smokeless tobacco. She reports that she does not drink alcohol or use drugs.  Allergies: No Known Allergies  Medications Prior to Admission  Medication Sig Dispense Refill  . glycopyrrolate (ROBINUL) 2 MG tablet Take 2 mg by mouth 2 (two) times daily.    . sertraline (ZOLOFT) 50 MG tablet TAKE 1 TABLET BY MOUTH DAILY. 90 tablet 0  . Vitamin D, Ergocalciferol, (DRISDOL) 50000 units CAPS capsule Take 1 capsule (50,000 Units total) by mouth every 7 (seven) days. 12 capsule 0    Results for orders placed or performed during the hospital encounter of 08/08/18 (from the past 48 hour(s))  Pregnancy, urine POC     Status: None   Collection Time: 08/08/18  8:53 AM  Result Value Ref Range   Preg Test, Ur NEGATIVE NEGATIVE    Comment:        THE SENSITIVITY OF THIS METHODOLOGY IS >24 mIU/mL    No results found.  Review of Systems  All other systems reviewed and are negative.   Blood pressure 113/63, pulse (!) 55, temperature 98.2 F (36.8 C), temperature source Oral, resp. rate 16, height 5' 0.25" (1.53 m), weight 80 kg, last menstrual period 07/16/2018,  SpO2 100 %. Physical Exam  Constitutional: She is oriented to person, place, and time. She appears well-developed and well-nourished. No distress.  HENT:  Head: Normocephalic and atraumatic.  Right Ear: External ear normal.  Left Ear: External ear normal.  Nose: Nose normal.  Mouth/Throat: Oropharynx is clear and moist.  Eyes: Pupils are equal, round, and reactive to light. Conjunctivae and EOM are normal.  Neck: Normal range of motion. Neck supple.  Cardiovascular: Normal rate.  Respiratory: Effort normal.  Musculoskeletal: Normal range of motion.  Neurological: She is alert and oriented to person, place, and time. No cranial nerve deficit.  Skin: Skin is warm and dry.  Psychiatric: She has a normal mood and affect. Her behavior is normal. Judgment and thought content normal.     Assessment/Plan Tonsil stones To OR for tonsillectomy.  Christia ReadingBATES, Carlin Mamone, MD 08/08/2018, 9:51 AM

## 2018-08-08 NOTE — Anesthesia Preprocedure Evaluation (Addendum)
Anesthesia Evaluation  Patient identified by MRN, date of birth, ID band Patient awake    Reviewed: Allergy & Precautions, NPO status , Patient's Chart, lab work & pertinent test results  Airway Mallampati: II  TM Distance: >3 FB     Dental   Pulmonary asthma ,    breath sounds clear to auscultation       Cardiovascular + Peripheral Vascular Disease   Rhythm:Regular Rate:Normal     Neuro/Psych    GI/Hepatic Neg liver ROS, GERD  ,  Endo/Other  negative endocrine ROS  Renal/GU negative Renal ROS     Musculoskeletal   Abdominal   Peds  Hematology negative hematology ROS (+)   Anesthesia Other Findings   Reproductive/Obstetrics                             Anesthesia Physical Anesthesia Plan  ASA: III  Anesthesia Plan: General   Post-op Pain Management:    Induction: Intravenous  PONV Risk Score and Plan: Ondansetron, Dexamethasone and Midazolam  Airway Management Planned: Oral ETT  Additional Equipment:   Intra-op Plan:   Post-operative Plan: Extubation in OR  Informed Consent: I have reviewed the patients History and Physical, chart, labs and discussed the procedure including the risks, benefits and alternatives for the proposed anesthesia with the patient or authorized representative who has indicated his/her understanding and acceptance.   Dental advisory given  Plan Discussed with: CRNA and Anesthesiologist  Anesthesia Plan Comments:         Anesthesia Quick Evaluation

## 2018-08-08 NOTE — Brief Op Note (Signed)
08/08/2018  10:28 AM  PATIENT:  Lisa Rosario  42 y.o. female  PRE-OPERATIVE DIAGNOSIS:  Tonsil Stones  POST-OPERATIVE DIAGNOSIS:  Tonsil Stones  PROCEDURE:  Procedure(s): TONSILLECTOMY (Bilateral)  SURGEON:  Surgeon(s) and Role:    Christia Reading* Reynol Arnone, MD - Primary  PHYSICIAN ASSISTANT:   ASSISTANTS: none   ANESTHESIA:   general  EBL:  Minimal  BLOOD ADMINISTERED:none  DRAINS: none   LOCAL MEDICATIONS USED:  NONE  SPECIMEN:  Source of Specimen:  Right and left tonsils  DISPOSITION OF SPECIMEN:  PATHOLOGY  COUNTS:  YES  TOURNIQUET:  * No tourniquets in log *  DICTATION: .Other Dictation: Dictation Number [id number]  PLAN OF CARE: Observation in [facility]  PATIENT DISPOSITION:  PACU - hemodynamically stable.   Delay start of Pharmacological VTE agent (>24hrs) due to surgical blood loss or risk of bleeding: yes

## 2018-08-08 NOTE — Op Note (Signed)
NAMTami Ribas: Rosario, Lisa T. MEDICAL RECORD [id number] ACCOUNT [id number] DATE OF BIRTH:1976/01/20 FACILITY: [facility] LOCATION: [facility] PHYSICIAN:Nilani Hugill Pearletha Alfred. Elgar Scoggins, MD  OPERATIVE REPORT  DATE OF PROCEDURE:  08/08/2018  PREOPERATIVE DIAGNOSIS:  Recurrent tonsil stones.    POSTOPERATIVE DIAGNOSIS:  Recurrent tonsil stones.  PROCEDURE:  Tonsillectomy.  SURGEON:  Christia Readingwight Ardell Makarewicz, MD  ANESTHESIA:  General endotracheal anesthesia.  COMPLICATIONS:  None.  INDICATIONS:  The patient is a 42 year old female with a long history of frequently recurring tonsil stones who presents for surgical management.  FINDINGS:  Tonsils were 2+ with embedded stones in both sides.  DESCRIPTION OF PROCEDURE:  The patient was identified in the holding room, informed consent having been obtained including discussion of risks, benefits and alternatives.  The patient was brought to the operative suite and put on the operative table in  supine position.  Anesthesia was induced and the patient was intubated by the anesthesia team without difficulty.  The patient was given intravenous steroids during the case.  The eyes were taped closed and the bed was turned 90 degrees from anesthesia.   Head wrap was placed around the patient's head and a Crowe-Davis retractor was inserted in the mouth and opened to reveal the oropharynx.  This was placed in suspension on the Mayo stand.  The right tonsil was grasped with a curved Allis and retracted  medially while a curvilinear incision was made along the anterior tonsillar pillar using Bovie electrocautery on a setting of 20.  Dissection continued in a subcapsular plane until the tonsil was removed.  The same procedure was then carried out on the  left side.  Tonsils were sent separately for pathology.  Bleeding was then controlled on both sides using suction cautery on a setting of 30.  After this was completed, the nose and throat were copiously irrigated with saline and flexible catheter  was  passed down the esophagus to suction out stomach and esophagus.  The retractor was taken out of suspension and removed from the patient's mouth.  She was turned back to anesthesia for wakeup and was extubated and taken to the recovery room in stable  condition.  TN/NUANCE  D:08/08/2018 T:08/08/2018 JOB:[id number]

## 2018-08-08 NOTE — Anesthesia Procedure Notes (Signed)
Procedure Name: Intubation Date/Time: 08/08/2018 10:04 AM Performed by: Signe Colt, CRNA Pre-anesthesia Checklist: Patient identified, Emergency Drugs available, Suction available and Patient being monitored Patient Re-evaluated:Patient Re-evaluated prior to induction Oxygen Delivery Method: Circle system utilized Preoxygenation: Pre-oxygenation with 100% oxygen Induction Type: IV induction Ventilation: Mask ventilation without difficulty Laryngoscope Size: Mac and 3 Grade View: Grade I Tube type: Oral Number of attempts: 1 Airway Equipment and Method: Stylet and Oral airway Placement Confirmation: ETT inserted through vocal cords under direct vision,  positive ETCO2 and breath sounds checked- equal and bilateral Secured at: 21 cm Tube secured with: Tape Dental Injury: Teeth and Oropharynx as per pre-operative assessment

## 2018-08-08 NOTE — Transfer of Care (Signed)
Immediate Anesthesia Transfer of Care Note  Patient: Lisa Rosario  Procedure(s) Performed: TONSILLECTOMY (Bilateral Mouth)  Patient Location: PACU  Anesthesia Type:General  Level of Consciousness: awake, alert , oriented and patient cooperative  Airway & Oxygen Therapy: Patient Spontanous Breathing and Patient connected to face mask oxygen  Post-op Assessment: Report given to RN and Post -op Vital signs reviewed and stable  Post vital signs: Reviewed and stable  Last Vitals:  Vitals Value Taken Time  BP    Temp    Pulse    Resp 17 08/08/2018 10:44 AM  SpO2    Vitals shown include unvalidated device data.  Last Pain:  Vitals:   08/08/18 0916  TempSrc: Oral  PainSc: 0-No pain      Patients Stated Pain Goal: 3 (08/08/18 0916)  Complications: No apparent anesthesia complications

## 2018-08-11 ENCOUNTER — Encounter (HOSPITAL_BASED_OUTPATIENT_CLINIC_OR_DEPARTMENT_OTHER): Payer: Self-pay | Admitting: Otolaryngology

## 2018-10-04 ENCOUNTER — Other Ambulatory Visit: Payer: Self-pay | Admitting: Internal Medicine

## 2018-11-10 ENCOUNTER — Other Ambulatory Visit: Payer: Self-pay | Admitting: Internal Medicine

## 2019-05-08 ENCOUNTER — Other Ambulatory Visit: Payer: Self-pay | Admitting: Internal Medicine

## 2019-06-11 ENCOUNTER — Encounter: Payer: Self-pay | Admitting: Internal Medicine

## 2019-06-11 ENCOUNTER — Telehealth: Payer: BC Managed Care – PPO

## 2019-06-11 ENCOUNTER — Ambulatory Visit (INDEPENDENT_AMBULATORY_CARE_PROVIDER_SITE_OTHER): Payer: BC Managed Care – PPO | Admitting: Internal Medicine

## 2019-06-11 DIAGNOSIS — L0292 Furuncle, unspecified: Secondary | ICD-10-CM | POA: Diagnosis not present

## 2019-06-11 MED ORDER — SULFAMETHOXAZOLE-TRIMETHOPRIM 800-160 MG PO TABS: 1.0000 | ORAL_TABLET | Freq: Two times a day (BID) | ORAL | 0 refills | Status: DC

## 2019-06-11 NOTE — Progress Notes (Signed)
Virtual Visit via Video Note  I connected with Lisa Rosario on 06/11/19 at  3:45 PM EDT by a video enabled telemedicine application and verified that I am speaking with the correct person using two identifiers.  Location: Patient: Home Provider: Office   I discussed the limitations of evaluation and management by telemedicine and the availability of in person appointments. The patient expressed understanding and agreed to proceed.  History of Present Illness:  Patient reports a small bleeding bump over her c section scar. She noticed this 10 days ago. She reports it is tender to touch, but not red or warm. She has not noticed any pus or drainage from the area. She frequently has boils but reports this does not look like one of her normal boils. She denies any injury that could have opened the scar. She has been cleaning it with alcohol and putting Neosporin on it with minimal relief.      Past Medical History:  Diagnosis Date  . Allergy   . Chicken pox   . Childhood asthma   . Depression     Current Outpatient Medications  Medication Sig Dispense Refill  . glycopyrrolate (ROBINUL) 2 MG tablet Take 2 mg by mouth 2 (two) times daily.    Marland Kitchen HYDROcodone-acetaminophen (HYCET) 7.5-325 mg/15 ml solution Take 15 mLs by mouth every 4 (four) hours as needed for moderate pain. 473 mL 0  . sertraline (ZOLOFT) 50 MG tablet Take 1 tablet (50 mg total) by mouth daily. MUST SCHEDULE PHYSICAL 90 tablet 0  . Vitamin D, Ergocalciferol, (DRISDOL) 50000 units CAPS capsule Take 1 capsule (50,000 Units total) by mouth every 7 (seven) days. 12 capsule 0   No current facility-administered medications for this visit.     No Known Allergies  Family History  Problem Relation Age of Onset  . Hyperlipidemia Mother   . Hypertension Mother   . Hyperlipidemia Maternal Grandmother   . Heart disease Maternal Grandmother   . Hyperlipidemia Maternal Grandfather   . Heart disease Maternal Grandfather   .  Hyperlipidemia Paternal Grandmother   . Heart disease Paternal Grandmother   . Hyperlipidemia Paternal Grandfather     Social History   Socioeconomic History  . Marital status: Married    Spouse name: Not on file  . Number of children: Not on file  . Years of education: Not on file  . Highest education level: Not on file  Occupational History  . Not on file  Social Needs  . Financial resource strain: Not on file  . Food insecurity    Worry: Not on file    Inability: Not on file  . Transportation needs    Medical: Not on file    Non-medical: Not on file  Tobacco Use  . Smoking status: Never Smoker  . Smokeless tobacco: Never Used  Substance and Sexual Activity  . Alcohol use: No    Alcohol/week: 0.0 standard drinks  . Drug use: No  . Sexual activity: Yes  Lifestyle  . Physical activity    Days per week: Not on file    Minutes per session: Not on file  . Stress: Not on file  Relationships  . Social Herbalist on phone: Not on file    Gets together: Not on file    Attends religious service: Not on file    Active member of club or organization: Not on file    Attends meetings of clubs or organizations: Not on file  Relationship status: Not on file  . Intimate partner violence    Fear of current or ex partner: Not on file    Emotionally abused: Not on file    Physically abused: Not on file    Forced sexual activity: Not on file  Other Topics Concern  . Not on file  Social History Narrative  . Not on file     Constitutional: Denies fever, malaise, fatigue, headache or abrupt weight changes.  Respiratory: Denies difficulty breathing, shortness of breath, cough or sputum production.   Cardiovascular: Denies chest pain, chest tightness, palpitations or swelling in the hands or feet.  Skin: Pt reports lesion over c section scar. Denies ulcercations.    No other specific complaints in a complete review of systems (except as listed in HPI  above).  Observations/Objective:  Wt Readings from Last 3 Encounters:  08/08/18 176 lb 5.9 oz (80 kg)  07/15/18 179 lb (81.2 kg)  10/24/17 176 lb (79.8 kg)    General: Appears her stated age, well developed, well nourished in NAD. Skin: Warm, dry and intact. 1 cm round raised hyperpigmented lesion with central opening. No drainage noted or expressed by pt. Pulmonary/Chest: Normal effort. No respiratory distress    BMET    Component Value Date/Time   NA 139 07/15/2018 1541   K 3.4 (L) 07/15/2018 1541   CL 106 07/15/2018 1541   CO2 25 07/15/2018 1541   GLUCOSE 89 07/15/2018 1541   BUN 8 07/15/2018 1541   CREATININE 0.78 07/15/2018 1541   CALCIUM 8.7 07/15/2018 1541   GFRNONAA >60 10/13/2017 2102   GFRAA >60 10/13/2017 2102    Lipid Panel     Component Value Date/Time   CHOL 171 07/15/2018 1541   TRIG 59.0 07/15/2018 1541   HDL 69.50 07/15/2018 1541   CHOLHDL 2 07/15/2018 1541   VLDL 11.8 07/15/2018 1541   LDLCALC 90 07/15/2018 1541    CBC    Component Value Date/Time   WBC 9.7 07/15/2018 1541   RBC 3.59 (L) 07/15/2018 1541   HGB 9.9 (L) 07/15/2018 1541   HCT 30.9 (L) 07/15/2018 1541   PLT 389.0 07/15/2018 1541   MCV 86.1 07/15/2018 1541   MCH 28.3 10/13/2017 2102   MCHC 32.1 07/15/2018 1541   RDW 14.8 07/15/2018 1541    Hgb A1C Lab Results  Component Value Date   HGBA1C 5.0 07/15/2018        Assessment and Plan:  Boil:  Warm compresses TID RX for Septra DS 1 tab PO BID x 7 days If no improvement by next week, consider in office evaluation  Return precautions discussed  Follow Up Instructions:    I discussed the assessment and treatment plan with the patient. The patient was provided an opportunity to ask questions and all were answered. The patient agreed with the plan and demonstrated an understanding of the instructions.   The patient was advised to call back or seek an in-person evaluation if the symptoms worsen or if the condition  fails to improve as anticipated.     Nicki Reaperegina Tye Juarez, NP

## 2019-06-12 NOTE — Patient Instructions (Signed)

## 2019-07-10 ENCOUNTER — Other Ambulatory Visit: Payer: Self-pay

## 2019-07-10 DIAGNOSIS — Z20822 Contact with and (suspected) exposure to covid-19: Secondary | ICD-10-CM

## 2019-07-11 LAB — NOVEL CORONAVIRUS, NAA: SARS-CoV-2, NAA: NOT DETECTED

## 2019-08-03 ENCOUNTER — Other Ambulatory Visit: Payer: Self-pay | Admitting: Internal Medicine

## 2019-09-02 ENCOUNTER — Other Ambulatory Visit: Payer: Self-pay | Admitting: Internal Medicine

## 2019-09-10 ENCOUNTER — Ambulatory Visit (INDEPENDENT_AMBULATORY_CARE_PROVIDER_SITE_OTHER)
Admission: RE | Admit: 2019-09-10 | Discharge: 2019-09-10 | Disposition: A | Discharge status: Home / Self Care | Payer: BC Managed Care – PPO | Source: Ambulatory Visit | Attending: Internal Medicine | Admitting: Internal Medicine

## 2019-09-10 ENCOUNTER — Ambulatory Visit (INDEPENDENT_AMBULATORY_CARE_PROVIDER_SITE_OTHER): Payer: BC Managed Care – PPO | Admitting: Internal Medicine

## 2019-09-10 ENCOUNTER — Other Ambulatory Visit: Payer: Self-pay

## 2019-09-10 VITALS — BP 110/76 | HR 63 | Temp 98.0°F | Ht 60.75 in | Wt 177.0 lb

## 2019-09-10 DIAGNOSIS — F341 Dysthymic disorder: Secondary | ICD-10-CM

## 2019-09-10 DIAGNOSIS — Z0000 Encounter for general adult medical examination without abnormal findings: Secondary | ICD-10-CM

## 2019-09-10 DIAGNOSIS — E559 Vitamin D deficiency, unspecified: Secondary | ICD-10-CM

## 2019-09-10 DIAGNOSIS — G8929 Other chronic pain: Secondary | ICD-10-CM | POA: Diagnosis not present

## 2019-09-10 DIAGNOSIS — M25512 Pain in left shoulder: Secondary | ICD-10-CM

## 2019-09-10 DIAGNOSIS — R61 Generalized hyperhidrosis: Secondary | ICD-10-CM

## 2019-09-10 DIAGNOSIS — D649 Anemia, unspecified: Secondary | ICD-10-CM

## 2019-09-10 LAB — LIPID PANEL
Cholesterol: 167 mg/dL (ref 0–200)
HDL: 60.6 mg/dL (ref >39.00)
LDL Cholesterol: 92 mg/dL (ref 0–99)
NonHDL: 106.55
Total CHOL/HDL Ratio: 3
Triglycerides: 73 mg/dL (ref 0.0–149.0)
VLDL: 14.6 mg/dL (ref 0.0–40.0)

## 2019-09-10 LAB — HEMOGLOBIN A1C: Hgb A1c MFr Bld: 4.9 % (ref 4.6–6.5)

## 2019-09-10 LAB — COMPREHENSIVE METABOLIC PANEL
ALT: 5 U/L (ref 0–35)
AST: 10 U/L (ref 0–37)
Albumin: 3.5 g/dL (ref 3.5–5.2)
Alkaline Phosphatase: 91 U/L (ref 39–117)
BUN: 6 mg/dL (ref 6–23)
CO2: 28 mEq/L (ref 19–32)
Calcium: 8.4 mg/dL (ref 8.4–10.5)
Chloride: 105 mEq/L (ref 96–112)
Creatinine, Ser: 0.59 mg/dL (ref 0.40–1.20)
GFR: 135.47 mL/min (ref >60.00)
Glucose, Bld: 87 mg/dL (ref 70–99)
Potassium: 4.2 mEq/L (ref 3.5–5.1)
Sodium: 138 mEq/L (ref 135–145)
Total Bilirubin: 0.3 mg/dL (ref 0.2–1.2)
Total Protein: 6.3 g/dL (ref 6.0–8.3)

## 2019-09-10 LAB — CBC
HCT: 27.5 % — ABNORMAL LOW (ref 36.0–46.0)
Hemoglobin: 8.8 g/dL — ABNORMAL LOW (ref 12.0–15.0)
MCHC: 32.2 g/dL (ref 30.0–36.0)
MCV: 86.2 fl (ref 78.0–100.0)
Platelets: 406 10*3/uL — ABNORMAL HIGH (ref 150.0–400.0)
RBC: 3.19 Mil/uL — ABNORMAL LOW (ref 3.87–5.11)
RDW: 15.8 % — ABNORMAL HIGH (ref 11.5–15.5)
WBC: 8.4 10*3/uL (ref 4.0–10.5)

## 2019-09-10 LAB — VITAMIN D 25 HYDROXY (VIT D DEFICIENCY, FRACTURES): VITD: 11.75 ng/mL — ABNORMAL LOW (ref 30.00–100.00)

## 2019-09-10 NOTE — Progress Notes (Signed)
Subjective:    Patient ID: Lisa Rosario, female    DOB: 06-08-1976, 43 y.o.   MRN: [id number]  HPI  Pt presents to the clinic today for her annual exam. She is also due to follow up chronic conditions.  Depression: Chronic dysthymia. Stable on Sertraline. She is not currently seeing a therapist. She denies SI/HI.  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  Flu: 08/2018 Tetanus: 04/2011 Pap Smear: 11/2014 Mammogram: 12/2013 Vision Screening: Dentist: biannually  Diet: She eats lean meat. She consumes fruits and veggies daily. She does not eat a lot of fried foods. She drinks mostly water. Exercise: None  Review of Systems      Past Medical History:  Diagnosis Date  . Allergy   . Chicken pox   . Childhood asthma   . Depression     Current Outpatient Medications  Medication Sig Dispense Refill  . glycopyrrolate (ROBINUL) 2 MG tablet Take 2 mg by mouth 2 (two) times daily.    . sertraline (ZOLOFT) 50 MG tablet TAKE 1 TABLET (50 MG TOTAL) BY MOUTH DAILY. MUST SCHEDULE PHYSICAL 90 tablet 0   No current facility-administered medications for this visit.     No Known Allergies  Family History  Problem Relation Age of Onset  . Hyperlipidemia Mother   . Hypertension Mother   . Hyperlipidemia Maternal Grandmother   . Heart disease Maternal Grandmother   . Hyperlipidemia Maternal Grandfather   . Heart disease Maternal Grandfather   . Hyperlipidemia Paternal Grandmother   . Heart disease Paternal Grandmother   . Hyperlipidemia Paternal Grandfather     Social History   Socioeconomic History  . Marital status: Married    Spouse name: Not on file  . Number of children: Not on file  . Years of education: Not on file  . Highest education level: Not on file  Occupational History  . Not on file  Social Needs  . Financial resource strain: Not on file  . Food insecurity    Worry: Not on file    Inability: Not on file  . Transportation needs    Medical:  Not on file    Non-medical: Not on file  Tobacco Use  . Smoking status: Never Smoker  . Smokeless tobacco: Never Used  Substance and Sexual Activity  . Alcohol use: No    Alcohol/week: 0.0 standard drinks  . Drug use: No  . Sexual activity: Yes  Lifestyle  . Physical activity    Days per week: Not on file    Minutes per session: Not on file  . Stress: Not on file  Relationships  . Social Herbalist on phone: Not on file    Gets together: Not on file    Attends religious service: Not on file    Active member of club or organization: Not on file    Attends meetings of clubs or organizations: Not on file    Relationship status: Not on file  . Intimate partner violence    Fear of current or ex partner: Not on file    Emotionally abused: Not on file    Physically abused: Not on file    Forced sexual activity: Not on file  Other Topics Concern  . Not on file  Social History Narrative  . Not on file     Constitutional: Denies fever, malaise, fatigue, headache or abrupt weight changes.  HEENT: Denies eye pain, eye redness, ear pain, ringing in the ears, wax  buildup, runny nose, nasal congestion, bloody nose, or sore throat. Respiratory: Denies difficulty breathing, shortness of breath, cough or sputum production.   Cardiovascular: Denies chest pain, chest tightness, palpitations or swelling in the hands or feet.  Gastrointestinal: Denies abdominal pain, bloating, constipation, diarrhea or blood in the stool.  GU: Denies urgency, frequency, pain with urination, burning sensation, blood in urine, odor or discharge. Musculoskeletal: Pt reports left shoulder pain. Denies decrease in range of motion, difficulty with gait, muscle pain or joint swelling.  Skin: Denies redness, rashes, lesions or ulcercations.  Neurological: Denies dizziness, difficulty with memory, difficulty with speech or problems with balance and coordination.  Psych: Pt has a history of depression. Denies  anxiety, SI/HI.  No other specific complaints in a complete review of systems (except as listed in HPI above).  Objective:   Physical Exam  BP 110/76   Pulse 63   Temp 98 F (36.7 C) (Temporal)   Ht 5' 0.75" (1.543 m)   Wt 80.3 kg   LMP 08/24/2019   SpO2 98%   BMI 33.72 kg/m   Wt Readings from Last 3 Encounters:  08/08/18 176 lb 5.9 oz (80 kg)  07/15/18 179 lb (81.2 kg)  10/24/17 176 lb (79.8 kg)    General: Appears her stated age, obese, in NAD. Skin: Warm, dry and intact. No rashes noted. HEENT: Head: normal shape and size; Eyes: sclera white, no icterus, conjunctiva pink, PERRLA and EOMs intact; Ears: Tm's gray and intact, normal light reflex;  Neck:  Neck supple, trachea midline. No masses, lumps or thyromegaly present.  Cardiovascular: Normal rate and rhythm. S1,S2 noted.  No murmur, rubs or gallops noted. No JVD or BLE edema. Pulmonary/Chest: Normal effort and positive vesicular breath sounds. No respiratory distress. No wheezes, rales or ronchi noted.  Abdomen: Soft and nontender. Normal bowel sounds. No distention or masses noted. Liver, spleen and kidneys non palpable. Musculoskeletal: Normal internal, external rotation, abduction and adduction of the left shoulder. No pain with palpation of the left shoulder. Strength 5/5 BUE/BLE. No difficulty with gait.  Neurological: Alert and oriented. Cranial nerves II-XII grossly intact. Coordination normal.  Psychiatric: Mood and affect normal. Behavior is normal. Judgment and thought content normal.     BMET    Component Value Date/Time   NA 139 07/15/2018 1541   K 3.4 (L) 07/15/2018 1541   CL 106 07/15/2018 1541   CO2 25 07/15/2018 1541   GLUCOSE 89 07/15/2018 1541   BUN 8 07/15/2018 1541   CREATININE 0.78 07/15/2018 1541   CALCIUM 8.7 07/15/2018 1541   GFRNONAA >60 10/13/2017 2102   GFRAA >60 10/13/2017 2102    Lipid Panel     Component Value Date/Time   CHOL 171 07/15/2018 1541   TRIG 59.0 07/15/2018 1541    HDL 69.50 07/15/2018 1541   CHOLHDL 2 07/15/2018 1541   VLDL 11.8 07/15/2018 1541   LDLCALC 90 07/15/2018 1541    CBC    Component Value Date/Time   WBC 9.7 07/15/2018 1541   RBC 3.59 (L) 07/15/2018 1541   HGB 9.9 (L) 07/15/2018 1541   HCT 30.9 (L) 07/15/2018 1541   PLT 389.0 07/15/2018 1541   MCV 86.1 07/15/2018 1541   MCH 28.3 10/13/2017 2102   MCHC 32.1 07/15/2018 1541   RDW 14.8 07/15/2018 1541    Hgb A1C Lab Results  Component Value Date   HGBA1C 5.0 07/15/2018           Assessment & Plan:   Preventative Health  Maintenance:  Flu shot today Tetanus UTD Pap smear due 2021 Will start screening mammograms at 47 Encouraged her to consume a balanced diet and exercise regimen Advised her to see an eye doctor and dentist annually Will check CBC, CMET, Lipid, A1C  and Vit D today  Chronic Left Shoulder Pain:  Xray left shoulder today Encouraged shoulder exercise  Can take Ibuprofen as needed  RTC in 1 year, sooner if needed Nicki Reaper, NP

## 2019-09-10 NOTE — Patient Instructions (Signed)
Health Maintenance, Female Adopting a healthy lifestyle and getting preventive care are important in promoting health and wellness. Ask your health care provider about:  The right schedule for you to have regular tests and exams.  Things you can do on your own to prevent diseases and keep yourself healthy. What should I know about diet, weight, and exercise? Eat a healthy diet   Eat a diet that includes plenty of vegetables, fruits, low-fat dairy products, and lean protein.  Do not eat a lot of foods that are high in solid fats, added sugars, or sodium. Maintain a healthy weight Body mass index (BMI) is used to identify weight problems. It estimates body fat based on height and weight. Your health care provider can help determine your BMI and help you achieve or maintain a healthy weight. Get regular exercise Get regular exercise. This is one of the most important things you can do for your health. Most adults should:  Exercise for at least 150 minutes each week. The exercise should increase your heart rate and make you sweat (moderate-intensity exercise).  Do strengthening exercises at least twice a week. This is in addition to the moderate-intensity exercise.  Spend less time sitting. Even light physical activity can be beneficial. Watch cholesterol and blood lipids Have your blood tested for lipids and cholesterol at 41 years of age, then have this test every 5 years. Have your cholesterol levels checked more often if:  Your lipid or cholesterol levels are high.  You are older than 40 years of age.  You are at high risk for heart disease. What should I know about cancer screening? Depending on your health history and family history, you may need to have cancer screening at various ages. This may include screening for:  Breast cancer.  Cervical cancer.  Colorectal cancer.  Skin cancer.  Lung cancer. What should I know about heart disease, diabetes, and high blood  pressure? Blood pressure and heart disease  High blood pressure causes heart disease and increases the risk of stroke. This is more likely to develop in people who have high blood pressure readings, are of African descent, or are overweight.  Have your blood pressure checked: ? Every 3-5 years if you are 18-39 years of age. ? Every year if you are 40 years old or older. Diabetes Have regular diabetes screenings. This checks your fasting blood sugar level. Have the screening done:  Once every three years after age 40 if you are at a normal weight and have a low risk for diabetes.  More often and at a younger age if you are overweight or have a high risk for diabetes. What should I know about preventing infection? Hepatitis B If you have a higher risk for hepatitis B, you should be screened for this virus. Talk with your health care provider to find out if you are at risk for hepatitis B infection. Hepatitis C Testing is recommended for:  Everyone born from 1945 through 1965.  Anyone with known risk factors for hepatitis C. Sexually transmitted infections (STIs)  Get screened for STIs, including gonorrhea and chlamydia, if: ? You are sexually active and are younger than 41 years of age. ? You are older than 41 years of age and your health care provider tells you that you are at risk for this type of infection. ? Your sexual activity has changed since you were last screened, and you are at increased risk for chlamydia or gonorrhea. Ask your health care provider if   you are at risk.  Ask your health care provider about whether you are at high risk for HIV. Your health care provider may recommend a prescription medicine to help prevent HIV infection. If you choose to take medicine to prevent HIV, you should first get tested for HIV. You should then be tested every 3 months for as long as you are taking the medicine. Pregnancy  If you are about to stop having your period (premenopausal) and  you may become pregnant, seek counseling before you get pregnant.  Take 400 to 800 micrograms (mcg) of folic acid every day if you become pregnant.  Ask for birth control (contraception) if you want to prevent pregnancy. Osteoporosis and menopause Osteoporosis is a disease in which the bones lose minerals and strength with aging. This can result in bone fractures. If you are 65 years old or older, or if you are at risk for osteoporosis and fractures, ask your health care provider if you should:  Be screened for bone loss.  Take a calcium or vitamin D supplement to lower your risk of fractures.  Be given hormone replacement therapy (HRT) to treat symptoms of menopause. Follow these instructions at home: Lifestyle  Do not use any products that contain nicotine or tobacco, such as cigarettes, e-cigarettes, and chewing tobacco. If you need help quitting, ask your health care provider.  Do not use street drugs.  Do not share needles.  Ask your health care provider for help if you need support or information about quitting drugs. Alcohol use  Do not drink alcohol if: ? Your health care provider tells you not to drink. ? You are pregnant, may be pregnant, or are planning to become pregnant.  If you drink alcohol: ? Limit how much you use to 0-1 drink a day. ? Limit intake if you are breastfeeding.  Be aware of how much alcohol is in your drink. In the [place] one drink equals one 12 oz bottle of beer (355 mL), one 5 oz glass of wine (148 mL), or one 1 oz glass of hard liquor (44 mL). General instructions  Schedule regular health, dental, and eye exams.  Stay current with your vaccines.  Tell your health care provider if: ? You often feel depressed. ? You have ever been abused or do not feel safe at home. Summary  Adopting a healthy lifestyle and getting preventive care are important in promoting health and wellness.  Follow your health care provider's instructions about healthy  diet, exercising, and getting tested or screened for diseases.  Follow your health care provider's instructions on monitoring your cholesterol and blood pressure. This information is not intended to replace advice given to you by your health care provider. Make sure you discuss any questions you have with your health care provider. Document Released: 05/21/2011 Document Revised: 10/29/2018 Document Reviewed: 10/29/2018 Elsevier Patient Education  2020 Elsevier Inc.  

## 2019-09-18 ENCOUNTER — Encounter: Payer: Self-pay | Admitting: Internal Medicine

## 2019-09-18 NOTE — Assessment & Plan Note (Signed)
Stable on Sertraline Support offered today Will monitor 

## 2019-09-18 NOTE — Assessment & Plan Note (Signed)
No issues off Glycopyrrolate Will monitor

## 2019-09-21 ENCOUNTER — Encounter: Payer: Self-pay | Admitting: Internal Medicine

## 2019-09-22 MED ORDER — VITAMIN D (ERGOCALCIFEROL) 1.25 MG (50000 UNIT) PO CAPS: 50000.0000 [IU] | ORAL_CAPSULE | ORAL | 0 refills | Status: DC

## 2019-09-22 NOTE — Addendum Note (Signed)
Addended by: Lurlean Nanny on: 09/22/2019 05:19 PM   Modules accepted: Orders

## 2019-09-28 ENCOUNTER — Telehealth: Payer: BC Managed Care – PPO | Admitting: Physician Assistant

## 2019-09-28 ENCOUNTER — Ambulatory Visit (INDEPENDENT_AMBULATORY_CARE_PROVIDER_SITE_OTHER)
Admission: RE | Admit: 2019-09-28 | Discharge: 2019-09-28 | Disposition: A | Discharge status: Home / Self Care | Payer: BC Managed Care – PPO | Source: Ambulatory Visit

## 2019-09-28 DIAGNOSIS — T7840XA Allergy, unspecified, initial encounter: Secondary | ICD-10-CM

## 2019-09-28 DIAGNOSIS — R21 Rash and other nonspecific skin eruption: Secondary | ICD-10-CM | POA: Diagnosis not present

## 2019-09-28 MED ORDER — PREDNISONE 50 MG PO TABS: 50.0000 mg | ORAL_TABLET | Freq: Every day | ORAL | 0 refills | Status: DC

## 2019-09-28 MED ORDER — CETIRIZINE HCL 10 MG PO CAPS: 10.0000 mg | ORAL_CAPSULE | Freq: Every day | ORAL | 0 refills | Status: DC

## 2019-09-28 NOTE — Discharge Instructions (Signed)
Begin prednisone daily x 5 days Daily cetirizine Benadryl at night time Follow up in person if not resolving or worsening

## 2019-09-28 NOTE — ED Provider Notes (Signed)
Virtual Visit via Video Note:  CARMELLE BAMBERG  initiated request for Telemedicine visit with [facility] team. I connected with CEDRICA BRUNE  on 09/28/2019 at 9:49 AM  for a synchronized telemedicine visit using a video enabled HIPPA compliant telemedicine application. I verified that I am speaking with Tami Ribas  using two identifiers. Hallie C Wieters, PA-C  was physically located in a [facility] Urgent care site and BARBI KUMAGAI was located at a different location.   The limitations of evaluation and management by telemedicine as well as the availability of in-person appointments were discussed. Patient was informed that she  may incur a bill ( including co-pay) for this virtual visit encounter. Destiney T Scarpulla  expressed understanding and gave verbal consent to proceed with virtual visit.     History of Present Illness:Lisa Rosario  is a 43 y.o. female presents for evaluation of rash.  Patient states that Friday night she began to break out in a rash on her face.  Rash is been associated with itching.  Since the rash has spread to her trunk and extremities.  She describes the rash as splotches/welt-like.  Initially she thought it was related to a type of shampoo used by a new groomer for her dog on Friday.  Denies any other new soaps, lotions or detergents.  Denies any new foods or medicines.  Denies close contacts with similar rash.  Denies any fevers.  Has had some intermittent headaches.  No persistent nausea or vomiting.  She has been using Benadryl without relief.    No Known Allergies   Past Medical History:  Diagnosis Date  . Allergy   . Chicken pox   . Childhood asthma   . Depression      Social History   Tobacco Use  . Smoking status: Never Smoker  . Smokeless tobacco: Never Used  Substance Use Topics  . Alcohol use: No    Alcohol/week: 0.0 standard drinks  . Drug use: No        Observations/Objective: Physical Exam  Constitutional: She is oriented to  person, place, and time and well-developed, well-nourished, and in no distress. No distress.  HENT:  Head: Normocephalic and atraumatic.  Neck: Normal range of motion.  Pulmonary/Chest: Effort normal. No respiratory distress.  Speaking in full sentences  Neurological: She is alert and oriented to person, place, and time.  Speech clear, face symmetric  Skin: There is erythema.  Macular papular erythematous rash noted to forehead, around the neck     Assessment and Plan:    ICD-10-CM   1. Rash and nonspecific skin eruption  R21   2. Allergic reaction, initial encounter  T78.40XA      Rash suggestive of likely allergic reaction versus contact dermatitis.  Initiating on course of prednisone to further control rash and itching.  Continue antihistamines.  Does not seem to have any systemic involvement at this time, no airway compromise.  Continue to monitor,Discussed strict return precautions. Patient verbalized understanding and is agreeable with plan.   Follow Up Instructions:     I discussed the assessment and treatment plan with the patient. The patient was provided an opportunity to ask questions and all were answered. The patient agreed with the plan and demonstrated an understanding of the instructions.   The patient was advised to call back or seek an in-person evaluation if the symptoms worsen or if the condition fails to improve as anticipated.  Janith Lima, PA-C  09/28/2019 9:49 AM         Janith Lima, PA-C 09/28/19 [phone]

## 2019-09-28 NOTE — Progress Notes (Signed)
Hi Lisa Rosario,   I see you are currently in another telemedicine session.  I will end your evisit with me.  I hope you feel better soon.

## 2019-12-10 ENCOUNTER — Ambulatory Visit: Payer: BC Managed Care – PPO | Admitting: Internal Medicine

## 2019-12-10 ENCOUNTER — Other Ambulatory Visit: Payer: Self-pay

## 2019-12-10 ENCOUNTER — Ambulatory Visit (INDEPENDENT_AMBULATORY_CARE_PROVIDER_SITE_OTHER)
Admission: RE | Admit: 2019-12-10 | Discharge: 2019-12-10 | Disposition: A | Discharge status: Home / Self Care | Payer: BC Managed Care – PPO | Source: Ambulatory Visit | Attending: Internal Medicine | Admitting: Internal Medicine

## 2019-12-10 DIAGNOSIS — M542 Cervicalgia: Secondary | ICD-10-CM

## 2019-12-10 DIAGNOSIS — M25512 Pain in left shoulder: Secondary | ICD-10-CM

## 2019-12-10 MED ORDER — IBUPROFEN 600 MG PO TABS: 600.0000 mg | ORAL_TABLET | Freq: Three times a day (TID) | ORAL | 0 refills | Status: DC | PRN

## 2019-12-10 MED ORDER — CYCLOBENZAPRINE HCL 5 MG PO TABS: 5.0000 mg | ORAL_TABLET | Freq: Three times a day (TID) | ORAL | 0 refills | Status: DC | PRN
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
## 2019-12-13 ENCOUNTER — Encounter: Payer: Self-pay | Admitting: Internal Medicine

## 2019-12-13 ENCOUNTER — Other Ambulatory Visit: Payer: Self-pay | Admitting: Internal Medicine

## 2019-12-13 NOTE — Patient Instructions (Signed)

## 2019-12-14 ENCOUNTER — Other Ambulatory Visit: Payer: Self-pay | Admitting: Internal Medicine

## 2019-12-14 DIAGNOSIS — E559 Vitamin D deficiency, unspecified: Secondary | ICD-10-CM

## 2020-02-23 ENCOUNTER — Other Ambulatory Visit: Payer: Self-pay | Admitting: Internal Medicine

## 2020-02-24 MED ORDER — SERTRALINE HCL 50 MG PO TABS: 50.0000 mg | ORAL_TABLET | Freq: Every day | ORAL | 0 refills | Status: DC

## 2020-06-27 ENCOUNTER — Other Ambulatory Visit: Payer: Self-pay | Admitting: Internal Medicine

## 2020-06-27 ENCOUNTER — Encounter: Payer: Self-pay | Admitting: Internal Medicine

## 2020-06-29 MED ORDER — SERTRALINE HCL 50 MG PO TABS: 50.0000 mg | ORAL_TABLET | Freq: Every day | ORAL | 0 refills | Status: DC

## 2020-09-27 ENCOUNTER — Encounter: Payer: BC Managed Care – PPO | Admitting: Internal Medicine

## 2020-12-14 ENCOUNTER — Other Ambulatory Visit: Payer: Self-pay

## 2020-12-14 ENCOUNTER — Ambulatory Visit
Admission: RE | Admit: 2020-12-14 | Discharge: 2020-12-14 | Disposition: A | Discharge status: Home / Self Care | Payer: Self-pay | Source: Ambulatory Visit | Attending: Urgent Care | Admitting: Urgent Care

## 2020-12-14 VITALS — BP 119/71 | HR 73 | Temp 98.2°F | Resp 16

## 2020-12-14 DIAGNOSIS — R000 Tachycardia, unspecified: Secondary | ICD-10-CM

## 2020-12-14 DIAGNOSIS — M79661 Pain in right lower leg: Secondary | ICD-10-CM

## 2020-12-14 DIAGNOSIS — R0602 Shortness of breath: Secondary | ICD-10-CM

## 2020-12-14 DIAGNOSIS — R002 Palpitations: Secondary | ICD-10-CM

## 2020-12-14 DIAGNOSIS — Z20822 Contact with and (suspected) exposure to covid-19: Secondary | ICD-10-CM

## 2020-12-14 DIAGNOSIS — Z1152 Encounter for screening for COVID-19: Secondary | ICD-10-CM

## 2020-12-14 MED ORDER — NAPROXEN 500 MG PO TABS: 500.0000 mg | ORAL_TABLET | Freq: Two times a day (BID) | ORAL | 0 refills | Status: DC

## 2020-12-14 NOTE — ED Provider Notes (Signed)
[facility]   MRN: [id number] DOB: 10-01-1976  Subjective:   Lisa Rosario is a 45 y.o. female presenting for heart racing and shortness of breath today.  Patient felt this at lunch, lasted a few minutes and resolved on its own.  She is also had 2-week history of persistent intermittent bilateral leg swelling, worse over the right anterior lower leg today.  Denies history of blood clots, PE.  No history of clotting disorder.  Denies heart history.  No history of arrhythmia.  Family history is clear.  No active fever, chest pain, cough, shortness of breath, calf warmth or erythema.  No current facility-administered medications for this encounter.  Current Outpatient Medications:  .  Cetirizine HCl 10 MG CAPS, Take 1 capsule (10 mg total) by mouth daily for 10 days., Disp: 10 capsule, Rfl: 0 .  cyclobenzaprine (FLEXERIL) 5 MG tablet, Take 1 tablet (5 mg total) by mouth 3 (three) times daily as needed for muscle spasms., Disp: 20 tablet, Rfl: 0 .  ibuprofen (ADVIL) 600 MG tablet, Take 1 tablet (600 mg total) by mouth every 8 (eight) hours as needed., Disp: 30 tablet, Rfl: 0 .  sertraline (ZOLOFT) 50 MG tablet, Take 1 tablet (50 mg total) by mouth daily., Disp: 90 tablet, Rfl: 0 .  Vitamin D, Ergocalciferol, (DRISDOL) 1.25 MG (50000 UT) CAPS capsule, Take 1 capsule (50,000 Units total) by mouth every 7 (seven) days., Disp: 12 capsule, Rfl: 0   No Known Allergies  Past Medical History:  Diagnosis Date  . Allergy   . Chicken pox   . Childhood asthma   . Depression      Past Surgical History:  Procedure Laterality Date  . CESAREAN SECTION    . GASTRIC BYPASS  04/2008  . TONSILLECTOMY Bilateral 08/08/2018   Procedure: TONSILLECTOMY;  Surgeon: Christia Reading, MD;  Location: [facility];  Service: ENT;  Laterality: Bilateral;    Family History  Problem Relation Age of Onset  . Hyperlipidemia Mother   . Hypertension Mother   . Hyperlipidemia Maternal Grandmother    . Heart disease Maternal Grandmother   . Hyperlipidemia Maternal Grandfather   . Heart disease Maternal Grandfather   . Hyperlipidemia Paternal Grandmother   . Heart disease Paternal Grandmother   . Hyperlipidemia Paternal Grandfather     Social History   Tobacco Use  . Smoking status: Never Smoker  . Smokeless tobacco: Never Used  Vaping Use  . Vaping Use: Never used  Substance Use Topics  . Alcohol use: No    Alcohol/week: 0.0 standard drinks  . Drug use: No    ROS   Objective:   Vitals: BP 119/71 (BP Location: Right Arm)   Pulse 73   Temp 98.2 F (36.8 C) (Oral)   Resp 16   SpO2 97%   Physical Exam Constitutional:      General: She is not in acute distress.    Appearance: Normal appearance. She is well-developed. She is not ill-appearing, toxic-appearing or diaphoretic.  HENT:     Head: Normocephalic and atraumatic.     Nose: Nose normal.     Mouth/Throat:     Mouth: Mucous membranes are moist.  Eyes:     Extraocular Movements: Extraocular movements intact.     Pupils: Pupils are equal, round, and reactive to light.  Cardiovascular:     Rate and Rhythm: Normal rate and regular rhythm.     Pulses: Normal pulses.     Heart sounds: Normal heart sounds.  No murmur heard. No friction rub. No gallop.   Pulmonary:     Effort: Pulmonary effort is normal. No respiratory distress.     Breath sounds: Normal breath sounds. No stridor. No wheezing, rhonchi or rales.  Musculoskeletal:       Legs:  Skin:    General: Skin is warm and dry.     Findings: No rash.  Neurological:     Mental Status: She is alert and oriented to person, place, and time.  Psychiatric:        Mood and Affect: Mood normal.        Behavior: Behavior normal.        Thought Content: Thought content normal.     ED ECG REPORT   Date: 12/14/2020  EKG Time: 7:05 PM  Rate: 64bpm  Rhythm: normal sinus rhythm,  normal EKG, normal sinus rhythm  Narrative Interpretation: Sinus rhythm at 64  bpm, no previous EKG for comparison.   Assessment and Plan :   PDMP not reviewed this encounter.  1. Encounter for screening laboratory testing for COVID-19 virus   2. Racing heart beat   3. Palpitations   4. Pain in right lower leg   5. Shortness of breath     Suspect musculoskeletal pain.  Will rule out COVID-19 her leg pain may be body aches and she did have an episode of shortness of breath earlier today.  EKG reassuring.  Recommended follow-up with her PCP, for the discussion about possibility of an ultrasound to rule out a blood clot.  Otherwise, use conservative management, supportive care. Counseled patient on potential for adverse effects with medications prescribed/recommended today, ER and return-to-clinic precautions discussed, patient verbalized understanding.    Wallis Bamberg, [place] 12/14/20 1027

## 2020-12-14 NOTE — ED Triage Notes (Signed)
Pt said today at lunch she felt her heart racing and felt SOB but was gone within a few mins after it happened. Pt said her right leg is swollen and hurts.

## 2020-12-16 LAB — SARS-COV-2, NAA 2 DAY TAT

## 2020-12-16 LAB — NOVEL CORONAVIRUS, NAA: SARS-CoV-2, NAA: NOT DETECTED

## 2021-01-22 ENCOUNTER — Other Ambulatory Visit: Payer: Self-pay | Admitting: Internal Medicine

## 2021-02-13 ENCOUNTER — Encounter: Payer: Self-pay | Admitting: Internal Medicine
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
## 2021-10-26 ENCOUNTER — Ambulatory Visit: Payer: BC Managed Care – PPO | Attending: Internal Medicine | Admitting: Internal Medicine

## 2021-10-26 ENCOUNTER — Other Ambulatory Visit: Payer: Self-pay

## 2021-10-26 ENCOUNTER — Encounter: Payer: Self-pay | Admitting: Internal Medicine

## 2021-10-26 VITALS — BP 108/63 | HR 52 | Resp 16 | Ht 60.0 in | Wt 166.4 lb

## 2021-10-26 DIAGNOSIS — Z23 Encounter for immunization: Secondary | ICD-10-CM

## 2021-10-26 DIAGNOSIS — L02224 Furuncle of groin: Secondary | ICD-10-CM

## 2021-10-26 DIAGNOSIS — Z6832 Body mass index (BMI) 32.0-32.9, adult: Secondary | ICD-10-CM

## 2021-10-26 DIAGNOSIS — G4709 Other insomnia: Secondary | ICD-10-CM | POA: Diagnosis not present

## 2021-10-26 DIAGNOSIS — E669 Obesity, unspecified: Secondary | ICD-10-CM

## 2021-10-26 DIAGNOSIS — G2581 Restless legs syndrome: Secondary | ICD-10-CM | POA: Insufficient documentation

## 2021-10-26 DIAGNOSIS — Z114 Encounter for screening for human immunodeficiency virus [HIV]: Secondary | ICD-10-CM

## 2021-10-26 DIAGNOSIS — L732 Hidradenitis suppurativa: Secondary | ICD-10-CM | POA: Diagnosis not present

## 2021-10-26 DIAGNOSIS — Z1159 Encounter for screening for other viral diseases: Secondary | ICD-10-CM

## 2021-10-26 DIAGNOSIS — D649 Anemia, unspecified: Secondary | ICD-10-CM

## 2021-10-26 DIAGNOSIS — E559 Vitamin D deficiency, unspecified: Secondary | ICD-10-CM

## 2021-10-26 DIAGNOSIS — Z7689 Persons encountering health services in other specified circumstances: Secondary | ICD-10-CM

## 2021-10-26 MED ORDER — SULFAMETHOXAZOLE-TRIMETHOPRIM 800-160 MG PO TABS: 1.0000 | ORAL_TABLET | Freq: Two times a day (BID) | ORAL | 0 refills | Status: DC

## 2021-10-26 NOTE — Patient Instructions (Signed)
Healthy Eating Following a healthy eating pattern may help you to achieve and maintain a healthy body weight, reduce the risk of chronic disease, and live a long and productive life. It is important to follow a healthy eating pattern at an appropriate calorie level for your body. Your nutritional needs should be met primarily through food by choosing a variety of nutrient-rich foods. What are tips for following this plan? Reading food labels Read labels and choose the following: Reduced or low sodium. Juices with 100% fruit juice. Foods with low saturated fats and high polyunsaturated and monounsaturated fats. Foods with whole grains, such as whole wheat, cracked wheat, brown rice, and wild rice. Whole grains that are fortified with folic acid. This is recommended for women who are pregnant or who want to become pregnant. Read labels and avoid the following: Foods with a lot of added sugars. These include foods that contain brown sugar, corn sweetener, corn syrup, dextrose, fructose, glucose, high-fructose corn syrup, honey, invert sugar, lactose, malt syrup, maltose, molasses, raw sugar, sucrose, trehalose, or turbinado sugar. Do not eat more than the following amounts of added sugar per day: 6 teaspoons (25 g) for women. 9 teaspoons (38 g) for men. Foods that contain processed or refined starches and grains. Refined grain products, such as white flour, degermed cornmeal, white bread, and white rice. Shopping Choose nutrient-rich snacks, such as vegetables, whole fruits, and nuts. Avoid high-calorie and high-sugar snacks, such as potato chips, fruit snacks, and candy. Use oil-based dressings and spreads on foods instead of solid fats such as butter, stick margarine, or cream cheese. Limit pre-made sauces, mixes, and "instant" products such as flavored rice, instant noodles, and ready-made pasta. Try more plant-protein sources, such as tofu, tempeh, black beans, edamame, lentils, nuts, and  seeds. Explore eating plans such as the Mediterranean diet or vegetarian diet. Cooking Use oil to saut or stir-fry foods instead of solid fats such as butter, stick margarine, or lard. Try baking, boiling, grilling, or broiling instead of frying. Remove the fatty part of meats before cooking. Steam vegetables in water or broth. Meal planning  At meals, imagine dividing your plate into fourths: One-half of your plate is fruits and vegetables. One-fourth of your plate is whole grains. One-fourth of your plate is protein, especially lean meats, poultry, eggs, tofu, beans, or nuts. Include low-fat dairy as part of your daily diet. Lifestyle Choose healthy options in all settings, including home, work, school, restaurants, or stores. Prepare your food safely: Wash your hands after handling raw meats. Keep food preparation surfaces clean by regularly washing with hot, soapy water. Keep raw meats separate from ready-to-eat foods, such as fruits and vegetables. Cook seafood, meat, poultry, and eggs to the recommended internal temperature. Store foods at safe temperatures. In general: Keep cold foods at 38F (4.4C) or below. Keep hot foods at 138F (60C) or above. Keep your freezer at [facility] (-17.8C) or below. Foods are no longer safe to eat when they have been between the temperatures of 40-138F (4.4-60C) for more than 2 hours. What foods should I eat? Fruits Aim to eat 2 cup-equivalents of fresh, canned (in natural juice), or frozen fruits each day. Examples of 1 cup-equivalent of fruit include 1 small apple, 8 large strawberries, 1 cup canned fruit,  cup dried fruit, or 1 cup 100% juice. Vegetables Aim to eat 2-3 cup-equivalents of fresh and frozen vegetables each day, including different varieties and colors. Examples of 1 cup-equivalent of vegetables include 2 medium carrots, 2 cups  raw, leafy greens, 1 cup chopped vegetable (raw or cooked), or 1 medium baked potato. Grains Aim to  eat 6 ounce-equivalents of whole grains each day. Examples of 1 ounce-equivalent of grains include 1 slice of bread, 1 cup ready-to-eat cereal, 3 cups popcorn, or  cup cooked rice, pasta, or cereal. Meats and other proteins Aim to eat 5-6 ounce-equivalents of protein each day. Examples of 1 ounce-equivalent of protein include 1 egg, 1/2 cup nuts or seeds, or 1 tablespoon (16 g) peanut butter. A cut of meat or fish that is the size of a deck of cards is about 3-4 ounce-equivalents. Of the protein you eat each week, try to have at least 8 ounces come from seafood. This includes salmon, trout, herring, and anchovies. Dairy Aim to eat 3 cup-equivalents of fat-free or low-fat dairy each day. Examples of 1 cup-equivalent of dairy include 1 cup (240 mL) milk, 8 ounces (250 g) yogurt, 1 ounces (44 g) natural cheese, or 1 cup (240 mL) fortified soy milk. Fats and oils Aim for about 5 teaspoons (21 g) per day. Choose monounsaturated fats, such as canola and olive oils, avocados, peanut butter, and most nuts, or polyunsaturated fats, such as sunflower, corn, and soybean oils, walnuts, pine nuts, sesame seeds, sunflower seeds, and flaxseed. Beverages Aim for six 8-oz glasses of water per day. Limit coffee to three to five 8-oz cups per day. Limit caffeinated beverages that have added calories, such as soda and energy drinks. Limit alcohol intake to no more than 1 drink a day for nonpregnant women and 2 drinks a day for men. One drink equals 12 oz of beer (355 mL), 5 oz of wine (148 mL), or 1 oz of hard liquor (44 mL). Seasoning and other foods Avoid adding excess amounts of salt to your foods. Try flavoring foods with herbs and spices instead of salt. Avoid adding sugar to foods. Try using oil-based dressings, sauces, and spreads instead of solid fats. This information is based on general [place] nutrition guidelines. For more information, visit [employer]. Exact amounts may vary based on your nutrition  needs. Summary A healthy eating plan may help you to maintain a healthy weight, reduce the risk of chronic diseases, and stay active throughout your life. Plan your meals. Make sure you eat the right portions of a variety of nutrient-rich foods. Try baking, boiling, grilling, or broiling instead of frying. Choose healthy options in all settings, including home, work, school, restaurants, or stores. This information is not intended to replace advice given to you by your health care provider. Make sure you discuss any questions you have with your health care provider. Document Revised: 07/04/2021 Document Reviewed: 07/04/2021 Elsevier Patient Education  2022 [place]. Insomnia Insomnia is a sleep disorder that makes it difficult to fall asleep or stay asleep. Insomnia can cause fatigue, low energy, difficulty concentrating, mood swings, and poor performance at work or school. There are three different ways to classify insomnia: Difficulty falling asleep. Difficulty staying asleep. Waking up too early in the morning. Any type of insomnia can be long-term (chronic) or short-term (acute). Both are common. Short-term insomnia usually lasts for three months or less. Chronic insomnia occurs at least three times a week for longer than three months. What are the causes? Insomnia may be caused by another condition, situation, or substance, such as: Anxiety. Certain medicines. Gastroesophageal reflux disease (GERD) or other gastrointestinal conditions. Asthma or other breathing conditions. Restless legs syndrome, sleep apnea, or other sleep disorders. Chronic pain. Menopause.  Stroke. Abuse of alcohol, tobacco, or illegal drugs. Mental health conditions, such as depression. Caffeine. Neurological disorders, such as Alzheimer's disease. An overactive thyroid (hyperthyroidism). Sometimes, the cause of insomnia may not be known. What increases the risk? Risk factors for insomnia  include: Gender. Women are affected more often than men. Age. Insomnia is more common as you get older. Stress. Lack of exercise. Irregular work schedule or working night shifts. Traveling between different time zones. Certain medical and mental health conditions. What are the signs or symptoms? If you have insomnia, the main symptom is having trouble falling asleep or having trouble staying asleep. This may lead to other symptoms, such as: Feeling fatigued or having low energy. Feeling nervous about going to sleep. Not feeling rested in the morning. Having trouble concentrating. Feeling irritable, anxious, or depressed. How is this diagnosed? This condition may be diagnosed based on: Your symptoms and medical history. Your health care provider may ask about: Your sleep habits. Any medical conditions you have. Your mental health. A physical exam. How is this treated? Treatment for insomnia depends on the cause. Treatment may focus on treating an underlying condition that is causing insomnia. Treatment may also include: Medicines to help you sleep. Counseling or therapy. Lifestyle adjustments to help you sleep better. Follow these instructions at home: Eating and drinking  Limit or avoid alcohol, caffeinated beverages, and cigarettes, especially close to bedtime. These can disrupt your sleep. Do not eat a large meal or eat spicy foods right before bedtime. This can lead to digestive discomfort that can make it hard for you to sleep. Sleep habits  Keep a sleep diary to help you and your health care provider figure out what could be causing your insomnia. Write down: When you sleep. When you wake up during the night. How well you sleep. How rested you feel the next day. Any side effects of medicines you are taking. What you eat and drink. Make your bedroom a dark, comfortable place where it is easy to fall asleep. Put up shades or blackout curtains to block light from  outside. Use a white noise machine to block noise. Keep the temperature cool. Limit screen use before bedtime. This includes: Watching TV. Using your smartphone, tablet, or computer. Stick to a routine that includes going to bed and waking up at the same times every day and night. This can help you fall asleep faster. Consider making a quiet activity, such as reading, part of your nighttime routine. Try to avoid taking naps during the day so that you sleep better at night. Get out of bed if you are still awake after 15 minutes of trying to sleep. Keep the lights down, but try reading or doing a quiet activity. When you feel sleepy, go back to bed. General instructions Take over-the-counter and prescription medicines only as told by your health care provider. Exercise regularly, as told by your health care provider. Avoid exercise starting several hours before bedtime. Use relaxation techniques to manage stress. Ask your health care provider to suggest some techniques that may work well for you. These may include: Breathing exercises. Routines to release muscle tension. Visualizing peaceful scenes. Make sure that you drive carefully. Avoid driving if you feel very sleepy. Keep all follow-up visits as told by your health care provider. This is important. Contact a health care provider if: You are tired throughout the day. You have trouble in your daily routine due to sleepiness. You continue to have sleep problems, or your sleep problems  get worse. Get help right away if: You have serious thoughts about hurting yourself or someone else. If you ever feel like you may hurt yourself or others, or have thoughts about taking your own life, get help right away. You can go to your nearest emergency department or call: Your local emergency services (911 in the [place] A suicide crisis helpline, such as the [place] at 403-871-3336 or 988 in the [place]. This is open 24 hours  a day. Summary Insomnia is a sleep disorder that makes it difficult to fall asleep or stay asleep. Insomnia can be long-term (chronic) or short-term (acute). Treatment for insomnia depends on the cause. Treatment may focus on treating an underlying condition that is causing insomnia. Keep a sleep diary to help you and your health care provider figure out what could be causing your insomnia. This information is not intended to replace advice given to you by your health care provider. Make sure you discuss any questions you have with your health care provider. Document Revised: 05/31/2021 Document Reviewed: 09/15/2020 Elsevier Patient Education  2022 [place]. Hidradenitis Suppurativa Hidradenitis suppurativa is a long-term (chronic) skin disease. It is similar to a severe form of acne, but it affects areas of the body where acne would be unusual, especially areas of the body where skin rubs against skin and becomes moist. These include: Underarms. Groin. Genital area. Buttocks. Upper thighs. Breasts. Hidradenitis suppurativa may start out as small lumps or pimples caused by blocked sweat glands or hair follicles. Pimples may develop into deep sores that break open (rupture) and drain pus. Over time, affected areas of skin may thicken and become scarred. This condition is rare and does not spread from person to person (non-contagious). What are the causes? The exact cause of this condition is not known. It may be related to: Female and female hormones. An overactive disease-fighting system (immune system). The immune system may over-react to blocked hair follicles or sweat glands and cause swelling and pus-filled sores. What increases the risk? You are more likely to develop this condition if you: Are female. Are 85-2 years old. Have a family history of hidradenitis suppurativa. Have a personal history of acne. Are overweight. Smoke. Take the medicine lithium. What are the signs or  symptoms? The first symptoms are usually painful bumps in the skin, similar to pimples. The condition may get worse over time (progress), or it may only cause mild symptoms. If the disease progresses, symptoms may include: Skin bumps getting bigger and growing deeper into the skin. Bumps rupturing and draining pus. Itchy, infected skin. Skin getting thicker and scarred. Tunnels under the skin (fistulas) where pus drains from a bump. Pain during daily activities, such as pain during walking if your groin area is affected. Emotional problems, such as stress or depression. This condition may affect your appearance and your ability or willingness to wear certain clothes or do certain activities. How is this diagnosed? This condition is diagnosed by a health care provider who specializes in skin diseases (dermatologist). You may be diagnosed based on: Your symptoms and medical history. A physical exam. Testing a pus sample for infection. Blood tests. How is this treated? Your treatment will depend on how severe your symptoms are. The same treatment will not work for everybody with this condition. You may need to try several treatments to find what works best for you. Treatment may include: Cleaning and bandaging (dressing) your wounds as needed. Lifestyle changes, such as new skin care routines.  Taking medicines, such as: Antibiotics. Acne medicines. Medicines to reduce the activity of the immune system. A diabetes medicine (metformin). Birth control pills, for women. Steroids to reduce swelling and pain. Working with a mental health care provider, if you experience emotional distress due to this condition. If you have severe symptoms that do not get better with medicine, you may need surgery. Surgery may involve: Using a laser to clear the skin and remove hair follicles. Opening and draining deep sores. Removing the areas of skin that are diseased and scarred. Follow these instructions at  home: Medicines  Take over-the-counter and prescription medicines only as told by your health care provider. If you were prescribed an antibiotic medicine, take it as told by your health care provider. Do not stop taking the antibiotic even if your condition improves. Skin care If you have open wounds, cover them with a clean dressing as told by your health care provider. Keep wounds clean by washing them gently with soap and water when you bathe. Do not shave the areas where you get hidradenitis suppurativa. Do not wear deodorant. Wear loose-fitting clothes. Try to avoid getting overheated or sweaty. If you get sweaty or wet, change into clean, dry clothes as soon as you can. To help relieve pain and itchiness, cover sore areas with a warm, clean washcloth (warm compress) for 5-10 minutes as often as needed. If told by your health care provider, take a bleach bath twice a week: Fill your bathtub halfway with water. Pour in  cup of unscented household bleach. Soak in the tub for 5-10 minutes. Only soak from the neck down. Avoid water on your face and hair. Shower to rinse off the bleach from your skin. General instructions Learn as much as you can about your disease so that you have an active role in your treatment. Work closely with your health care provider to find treatments that work for you. If you are overweight, work with your health care provider to lose weight as recommended. Do not use any products that contain nicotine or tobacco, such as cigarettes and e-cigarettes. If you need help quitting, ask your health care provider. If you struggle with living with this condition, talk with your health care provider or work with a mental health care provider as recommended. Keep all follow-up visits as told by your health care provider. This is important. Where to find more information Hidradenitis [place].: https://www.hs-foundation.org/ American Academy of  Dermatology: http://www.[place].com/ Contact a health care provider if you have: A flare-up of hidradenitis suppurativa. A fever or chills. Trouble controlling your symptoms at home. Trouble doing your daily activities because of your symptoms. Trouble dealing with emotional problems related to your condition. Summary Hidradenitis suppurativa is a long-term (chronic) skin disease. It is similar to a severe form of acne, but it affects areas of the body where acne would be unusual. The first symptoms are usually painful bumps in the skin, similar to pimples. The condition may only cause mild symptoms, or it may get worse over time (progress). If you have open wounds, cover them with a clean dressing as told by your health care provider. Keep wounds clean by washing them gently with soap and water when you bathe. Besides skin care, treatment may include medicines, laser treatment, and surgery. This information is not intended to replace advice given to you by your health care provider. Make sure you discuss any questions you have with your health care provider. Document Revised: 08/30/2020 Document Reviewed: 08/30/2020  Elsevier Patient Education  2022 [employer].

## 2021-10-26 NOTE — Progress Notes (Signed)
Patient ID: Lisa Rosario, female    DOB: 1976-04-18  MRN: [id number]  CC: New Patient (Initial Visit), boils (8 boils ), and Insomnia   Subjective: Lisa Rosario is a 45 y.o. female who presents for new pt visit Her concerns today include:  Patient with history of  childhood asthma, vitamin D deficiency, normocytic anemia, weight reduction surgery in 2009  Reveals PCP with Lisa Reaper, NP.  Pt preferred to be seen here and want an AA physician.    Patient complains of insomnia. Over the past 3 months she has noted intermittent feelings of tingling like bugs crawling on her limbs upper or lower when she is trying to fall asleep.  She has to move the extremity that is being affected for it to go away.   Not sure if she snores and husband never mentioned to her if she does. She also complains of problems falling and staying asleep through the night.  She used to be able to sleep well. -Gets in bed around 11 PM.  She scrolls through her phone until about 11:30 PM then she falls asleep around midnight.  She sleeps with the TV on throughout the night not by choice.  Her husband prefers to sleeps with the TV on. -Sleeps for about 1 to 2 hours then has to get up to use the restroom.  About half the time she is unable to fall back asleep again. -Drinks sweet tea, [employer] or diet Dr. Reino Kent about an 8 ounce of one of these with her dinner every night at 7:30 PM.  She does not drink alcoholic beverages at night.  She is not trying anything to help with sleep.  Boils: c/o boils that come and go in groin area for years.  She currently has a small one on the mons pubis.  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  She had one of them lanced about 15 years ago.  Saw the dermatologist for it in 2019.  Given an oral medication (not sure if it was an abx) and a topical solution to use whenever she felt she was having an outbreak.  She is out of those.  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  Weight started at 220 pounds.  She was able to get down to 130 pounds.  Currently she is at 160 pounds.  She is not taking any multivitamins but admits that she was told after she had her surgery that she would need to be on them.  I note in her chart that she has vitamin D deficiency.  She also had normocytic anemia noted on CBC done 2 years ago. She attributes weight gain to some family stresses.  She has a 28 year old son who is dealing with some mental issues.  She has gotten off track with her eating habits because of this.  She has also not been exercising but plans to start doing yoga and meditation, the beginning of the new year.  HM:  no pap in a while.  Agreeable to HIV and hepatitis C screening.  She has had a flu shot already for the season.  Past medical, surgical, family history and social history reviewed and updated. Patient Active Problem List   Diagnosis Date Noted  . Excessive sweating 05/17/2016  . Depression 04/26/2015     Current Outpatient Medications on File Prior to Visit  Medication Sig Dispense Refill  . Cetirizine HCl 10 MG CAPS Take 1 capsule (10  mg total) by mouth daily for 10 days. 10 capsule 0  . cyclobenzaprine (FLEXERIL) 5 MG tablet Take 1 tablet (5 mg total) by mouth 3 (three) times daily as needed for muscle spasms. (Patient not taking: Reported on 10/26/2021) 20 tablet 0  . ibuprofen (ADVIL) 600 MG tablet Take 1 tablet (600 mg total) by mouth every 8 (eight) hours as needed. (Patient not taking: Reported on 10/26/2021) 30 tablet 0  . naproxen (NAPROSYN) 500 MG tablet Take 1 tablet (500 mg total) by mouth 2 (two) times daily with a meal. (Patient not taking: Reported on 10/26/2021) 30 tablet 0  . sertraline (ZOLOFT) 50 MG tablet TAKE 1 TABLET BY MOUTH EVERY DAY (Patient not taking: Reported on 10/26/2021) 90 tablet 0  . Vitamin D, Ergocalciferol, (DRISDOL) 1.25 MG (50000 UT) CAPS capsule Take 1 capsule (50,000 Units total) by mouth every 7 (seven) days.  (Patient not taking: Reported on 10/26/2021) 12 capsule 0   No current facility-administered medications on file prior to visit.    No Known Allergies  Social History   Socioeconomic History  . Marital status: Married    Spouse name: Not on file  . Number of children: Not on file  . Years of education: Not on file  . Highest education level: Not on file  Occupational History  . Not on file  Tobacco Use  . Smoking status: Never  . Smokeless tobacco: Never  Vaping Use  . Vaping Use: Never used  Substance and Sexual Activity  . Alcohol use: No    Alcohol/week: 0.0 standard drinks  . Drug use: No  . Sexual activity: Yes  Other Topics Concern  . Not on file  Social History Narrative  . Not on file   Social Determinants of Health   Financial Resource Strain: Not on file  Food Insecurity: Not on file  Transportation Needs: Not on file  Physical Activity: Not on file  Stress: Not on file  Social Connections: Not on file  Intimate Partner Violence: Not on file    Family History  Problem Relation Age of Onset  . Hyperlipidemia Mother   . Hypertension Mother   . Hyperlipidemia Maternal Grandmother   . Heart disease Maternal Grandmother   . Hyperlipidemia Maternal Grandfather   . Heart disease Maternal Grandfather   . Hyperlipidemia Paternal Grandmother   . Heart disease Paternal Grandmother   . Hyperlipidemia Paternal Grandfather     Past Surgical History:  Procedure Laterality Date  . CESAREAN SECTION    . GASTRIC BYPASS  04/2008  . TONSILLECTOMY Bilateral 08/08/2018   Procedure: TONSILLECTOMY;  Surgeon: Christia Reading, MD;  Location: [facility];  Service: ENT;  Laterality: Bilateral;    ROS: Review of Systems Negative except as stated above  PHYSICAL EXAM: BP 108/63   Pulse (!) 52   Resp 16   Ht 5' (1.524 m)   Wt 166 lb 6.4 oz (75.5 kg)   SpO2 99%   BMI 32.50 kg/m   Wt Readings from Last 3 Encounters:  10/26/21 166 lb 6.4 oz (75.5 kg)   12/10/19 168 lb (76.2 kg)  09/10/19 177 lb (80.3 kg)    Physical Exam  General appearance - alert, well appearing, overweight middle-age Caucasian female and in no distress Mental status - normal mood, behavior, speech, dress, motor activity, and thought processes Eyes - pupils equal and reactive, extraocular eye movements intact Neck - supple, no significant adenopathy Chest - clear to auscultation, no wheezes, rales or rhonchi,  symmetric air entry Heart - normal rate, regular rhythm, normal S1, S2, no murmurs, rubs, clicks or gallops Extremities - peripheral pulses normal, no pedal edema, no clubbing or cyanosis Groin: CMA Jay'a Pollock present as chaperone: She is noted to have some scarring of the upper inner thigh, the perineal area and mons pubis from previous abscesses.  She currently has about a 3 cm soft tender small abscess that has slight drainage on the lower left mons pubis   Depression screen [facility] 2/9 10/26/2021 09/10/2019 07/15/2018  Decreased Interest 0 0 0  Down, Depressed, Hopeless 0 0 0  PHQ - 2 Score 0 0 0  Altered sleeping - 0 1  Tired, decreased energy - 0 0  Change in appetite - 0 1  Feeling bad or failure about yourself  - 0 0  Trouble concentrating - 0 0  Moving slowly or fidgety/restless - 0 0  Suicidal thoughts - 0 0  PHQ-9 Score - 0 2  Difficult doing work/chores - Not difficult at all Not difficult at all   GAD 7 : Generalized Anxiety Score 10/26/2021  Nervous, Anxious, on Edge 0  Control/stop worrying 2  Worry too much - different things 1  Trouble relaxing 0  Restless 0  Easily annoyed or irritable 1  Afraid - awful might happen 1  Total GAD 7 Score 5     CMP Latest Ref Rng & Units 09/10/2019 07/15/2018 10/13/2017  Glucose 70 - 99 mg/dL 87 89 90  BUN 6 - 23 mg/dL 6 8 12   Creatinine 0.40 - 1.20 mg/dL 8.08 8.11  Sodium 135 - 145 mEq/L 138 139 139  Potassium 3.5 - 5.1 mEq/L 4.2 3.4(L) 3.6  Chloride 96 - 112 mEq/L 105 106 106  CO2 19 - 32  mEq/L 28 25 24   Calcium 8.4 - 10.5 mg/dL 8.4 8.7 0.31)  Total Protein 6.0 - 8.3 g/dL 6.3 7.5 7.1  Total Bilirubin 0.2 - 1.2 mg/dL 0.3 0.3 0.3  Alkaline Phos 39 - 117 U/L 91 89 85  AST 0 - 37 U/L 10 12 20   ALT 0 - 35 U/L 5 5 10(L)   Lipid Panel     Component Value Date/Time   CHOL 167 09/10/2019 1239   TRIG 73.0 09/10/2019 1239   HDL 60.60 09/10/2019 1239   CHOLHDL 3 09/10/2019 1239   VLDL 14.6 09/10/2019 1239   LDLCALC 92 09/10/2019 1239    CBC    Component Value Date/Time   WBC 8.4 09/10/2019 1239   RBC 3.19 (L) 09/10/2019 1239   HGB 8.8 Repeated and verified X2. (L) 09/10/2019 1239   HCT 27.5 (L) 09/10/2019 1239   PLT 406.0 (H) 09/10/2019 1239   MCV 86.2 09/10/2019 1239   MCH 28.3 10/13/2017 2102   MCHC 32.2 09/10/2019 1239   RDW 15.8 (H) 09/10/2019 1239    ASSESSMENT AND PLAN: 1. Establishing care with new doctor, encounter for  2. Other insomnia Good sleep hygiene discussed and encouraged. Patient advised not to drink any caffeinated beverages or excessive alcohol use within several hours of bedtime.  Advised to get in bed around about the same time every night.  Once in bed, turn off all lights and sounds including her phone and television.  If unable to fall asleep within 30 to 45 minutes of getting in bed, patient should get up and try to do something until she feels sleepy again.  At that time try getting back in bed.  3. RLS (restless legs syndrome)  History suggests she may have restless leg syndrome which can interfere with restful sleep.  A common cause can be anemia.  We will recheck CBC today and if she is still anemic we will add on iron studies.  If she is not anemic, we can try her with gabapentin or Requip.  I went over with her information about Requip that it is a medication that is used in Parkinson's but also indicated for restless leg syndrome.  Went over possible side effects of the medication including issues with impulse control and can lead to  things like excessive eating, hypersexual activity or compulsive shopping.  If we do have to put her on this medication, I told her to let me know if she develops any side effects like this. - CBC  4. Boil, groin Looks like she has hidradenitis.  Current boil is very small.  We will try treating with antibiotics.  If no improvement or gets worse, I told her to be seen in the emergency room to have this lanced. - sulfamethoxazole-trimethoprim (BACTRIM DS) 800-160 MG tablet; Take 1 tablet by mouth 2 (two) times daily.  Dispense: 14 tablet; Refill: 0  5. Hidradenitis suppurativa -Discussed diagnosis with her and printed information provided on this diagnosis. - Ambulatory referral to Dermatology  6. Obesity (BMI 30.0-34.9) Discussed the importance of healthy eating habits and regular exercise.  Patient states that she has received nutritional counseling in the past.  She plans to start eating smaller but more frequent meals.  In regards to exercise, I told her that the goal is to get in about 150 minutes/week total of some form of moderate intensity exercise like brisk walking, riding a bike etc. -Given that she has had a Roux-en-Y procedure, I strongly recommend that she purchase a One-A-Day woman's vitamin over-the-counter and take it daily.  We are checking CBC for anemia, vitamin B12 level and vitamin D - CBC - Comprehensive metabolic panel - Hemoglobin A1c - Lipid panel  7. Normocytic anemia - Vitamin B12  8. Vitamin D deficiency - Vitamin D, 25-hydroxy  9. Screening for HIV (human immunodeficiency virus) - HIV Antibody (routine testing w rflx)  10. Need for hepatitis C screening test - HCV Ab w Reflex to Quant PCR    Patient was given the opportunity to ask questions.  Patient verbalized understanding of the plan and was able to repeat key elements of the plan.   Orders Placed This Encounter  Procedures  . Tdap vaccine greater than or equal to 7yo IM     Requested  Prescriptions    No prescriptions requested or ordered in this encounter    No follow-ups on file.  Jonah Blue, MD, FACP

## 2021-10-27 LAB — COMPREHENSIVE METABOLIC PANEL
ALT: 5 IU/L (ref 0–32)
AST: 13 IU/L (ref 0–40)
Albumin/Globulin Ratio: 1.1 — ABNORMAL LOW (ref 1.2–2.2)
Albumin: 3.8 g/dL (ref 3.8–4.8)
Alkaline Phosphatase: 112 IU/L (ref 44–121)
BUN/Creatinine Ratio: 11 (ref 9–23)
BUN: 8 mg/dL (ref 6–24)
Bilirubin Total: 0.3 mg/dL (ref 0.0–1.2)
CO2: 22 mmol/L (ref 20–29)
Calcium: 9.1 mg/dL (ref 8.7–10.2)
Chloride: 103 mmol/L (ref 96–106)
Creatinine, Ser: 0.71 mg/dL (ref 0.57–1.00)
Globulin, Total: 3.4 g/dL (ref 1.5–4.5)
Glucose: 87 mg/dL (ref 70–99)
Potassium: 4 mmol/L (ref 3.5–5.2)
Sodium: 140 mmol/L (ref 134–144)
Total Protein: 7.2 g/dL (ref 6.0–8.5)
eGFR: 108 mL/min/{1.73_m2} (ref >59)

## 2021-10-27 LAB — CBC
Hematocrit: 25.9 % — ABNORMAL LOW (ref 34.0–46.6)
Hemoglobin: 7.9 g/dL — ABNORMAL LOW (ref 11.1–15.9)
MCH: 23 pg — ABNORMAL LOW (ref 26.6–33.0)
MCHC: 30.5 g/dL — ABNORMAL LOW (ref 31.5–35.7)
MCV: 76 fL — ABNORMAL LOW (ref 79–97)
Platelets: 456 10*3/uL — ABNORMAL HIGH (ref 150–450)
RBC: 3.43 x10E6/uL — ABNORMAL LOW (ref 3.77–5.28)
RDW: 16.3 % — ABNORMAL HIGH (ref 11.7–15.4)
WBC: 8.6 10*3/uL (ref 3.4–10.8)

## 2021-10-27 LAB — VITAMIN D 25 HYDROXY (VIT D DEFICIENCY, FRACTURES): Vit D, 25-Hydroxy: 13.6 ng/mL — ABNORMAL LOW (ref 30.0–100.0)

## 2021-10-27 LAB — LIPID PANEL
Chol/HDL Ratio: 2.5 ratio (ref 0.0–4.4)
Cholesterol, Total: 202 mg/dL — ABNORMAL HIGH (ref 100–199)
HDL: 82 mg/dL (ref >39)
LDL Chol Calc (NIH): 108 mg/dL — ABNORMAL HIGH (ref 0–99)
Triglycerides: 67 mg/dL (ref 0–149)
VLDL Cholesterol Cal: 12 mg/dL (ref 5–40)

## 2021-10-27 LAB — HCV INTERPRETATION

## 2021-10-27 LAB — HEMOGLOBIN A1C
Est. average glucose Bld gHb Est-mCnc: 108 mg/dL
Hgb A1c MFr Bld: 5.4 % (ref 4.8–5.6)

## 2021-10-27 LAB — HCV AB W REFLEX TO QUANT PCR: HCV Ab: 0.1 s/co ratio (ref 0.0–0.9)

## 2021-10-27 LAB — VITAMIN B12: Vitamin B-12: 199 pg/mL — ABNORMAL LOW (ref 232–1245)

## 2021-10-27 LAB — HIV ANTIBODY (ROUTINE TESTING W REFLEX): HIV Screen 4th Generation wRfx: NONREACTIVE

## 2021-10-28 ENCOUNTER — Telehealth: Payer: Self-pay | Admitting: Internal Medicine

## 2021-10-28 DIAGNOSIS — E611 Iron deficiency: Secondary | ICD-10-CM

## 2021-10-28 DIAGNOSIS — E538 Deficiency of other specified B group vitamins: Secondary | ICD-10-CM

## 2021-10-28 MED ORDER — CYANOCOBALAMIN 1000 MCG/ML IJ SOLN
1000.0000 ug | INTRAMUSCULAR | Status: DC
  Administered 2021-11-02 – 2021-12-06 (×2): 1000 ug via INTRAMUSCULAR

## 2021-10-28 MED ORDER — VITAMIN D (ERGOCALCIFEROL) 1.25 MG (50000 UNIT) PO CAPS: 50000.0000 [IU] | ORAL_CAPSULE | ORAL | 1 refills | Status: DC

## 2021-10-28 MED ORDER — FERROUS SULFATE 325 (65 FE) MG PO TABS: 325.0000 mg | ORAL_TABLET | Freq: Every day | ORAL | 1 refills | Status: AC

## 2021-10-28 NOTE — Telephone Encounter (Signed)
Phone call placed this morning to patient to go over lab results. Patient informed that hepatitis C and HIV screening test were negative. -She has significant anemia that looks like iron deficiency and has worsened compared to previous levels in the system.  I recommend taking iron supplement daily.  Prescription sent to her pharmacy. -Vitamin B12 and vitamin D deficiencies.  We will start her on high-dose vitamin D to take once a week.  We will place her on vitamin B12 injection monthly for the first 2 months and then after that we will have her take oral vitamin B12.  I will have my CMA call her to schedule appointment for B12 shot. -Kidney and liver function tests were okay. -Mild elevation in cholesterol level.  Informed that healthy eating habits and regular exercise will help to lower cholesterol.  However I recommend that she hold off on moderate intensity exercise until she has been on iron supplement for at least 1 month to get her blood cell count up.  She expressed understanding.  Results for orders placed or performed in visit on 10/26/21  HCV Ab w Reflex to Quant PCR  Result Value Ref Range   HCV Ab 0.1 0.0 - 0.9 s/co ratio  HIV Antibody (routine testing w rflx)  Result Value Ref Range   HIV Screen 4th Generation wRfx Non Reactive Non Reactive  CBC  Result Value Ref Range   WBC 8.6 3.4 - 10.8 x10E3/uL   RBC 3.43 (L) 3.77 - 5.28 x10E6/uL   Hemoglobin 7.9 (L) 11.1 - 15.9 g/dL   Hematocrit 25.9 (L) 34.0 - 46.6 %   MCV 76 (L) 79 - 97 fL   MCH 23.0 (L) 26.6 - 33.0 pg   MCHC 30.5 (L) 31.5 - 35.7 g/dL   RDW 16.3 (H) 11.7 - 15.4 %   Platelets 456 (H) 150 - 450 x10E3/uL  Comprehensive metabolic panel  Result Value Ref Range   Glucose 87 70 - 99 mg/dL   BUN 8 6 - 24 mg/dL   Creatinine, Ser 0.71 0.57 - 1.00 mg/dL   eGFR 108 >59 mL/min/1.73   BUN/Creatinine Ratio 11 9 - 23   Sodium 140 134 - 144 mmol/L   Potassium 4.0 3.5 - 5.2 mmol/L   Chloride 103 96 - 106 mmol/L   CO2 22 20 -  29 mmol/L   Calcium 9.1 8.7 - 10.2 mg/dL   Total Protein 7.2 6.0 - 8.5 g/dL   Albumin 3.8 3.8 - 4.8 g/dL   Globulin, Total 3.4 1.5 - 4.5 g/dL   Albumin/Globulin Ratio 1.1 (L) 1.2 - 2.2   Bilirubin Total 0.3 0.0 - 1.2 mg/dL   Alkaline Phosphatase 112 44 - 121 IU/L   AST 13 0 - 40 IU/L   ALT 5 0 - 32 IU/L  Hemoglobin A1c  Result Value Ref Range   Hgb A1c MFr Bld 5.4 4.8 - 5.6 %   Est. average glucose Bld gHb Est-mCnc 108 mg/dL  Lipid panel  Result Value Ref Range   Cholesterol, Total 202 (H) 100 - 199 mg/dL   Triglycerides 67 0 - 149 mg/dL   HDL 82 >39 mg/dL   VLDL Cholesterol Cal 12 5 - 40 mg/dL   LDL Chol Calc (NIH) 108 (H) 0 - 99 mg/dL   Chol/HDL Ratio 2.5 0.0 - 4.4 ratio  Vitamin D, 25-hydroxy  Result Value Ref Range   Vit D, 25-Hydroxy 13.6 (L) 30.0 - 100.0 ng/mL  Vitamin B12  Result Value Ref Range  Vitamin B-12 199 (L) 232 - 1,245 pg/mL  Interpretation:  Result Value Ref Range   HCV Interp 1: Comment

## 2021-10-30 NOTE — Telephone Encounter (Signed)
Contacted pt to schedule nurse visit for b12 injection pt didn't answer lvm. If pt calls back please schedule nurse visit for month of December and January. If pt is not able to start b12 injection till January please schedule for January and February

## 2021-11-02 ENCOUNTER — Other Ambulatory Visit: Payer: Self-pay

## 2021-11-02 ENCOUNTER — Ambulatory Visit: Payer: BC Managed Care – PPO | Attending: Internal Medicine

## 2021-11-02 DIAGNOSIS — E538 Deficiency of other specified B group vitamins: Secondary | ICD-10-CM

## 2021-12-06 ENCOUNTER — Ambulatory Visit: Payer: BC Managed Care – PPO | Attending: Internal Medicine

## 2021-12-06 ENCOUNTER — Other Ambulatory Visit: Payer: Self-pay

## 2021-12-06 DIAGNOSIS — E538 Deficiency of other specified B group vitamins: Secondary | ICD-10-CM

## 2021-12-06 NOTE — Progress Notes (Signed)
Pt arrived for b-12 injection Pt given injection in right deltoid Pt tolerated shot well.

## 2021-12-26 ENCOUNTER — Encounter: Payer: Self-pay | Admitting: Internal Medicine

## 2021-12-26 ENCOUNTER — Ambulatory Visit: Payer: BC Managed Care – PPO | Attending: Internal Medicine | Admitting: Internal Medicine

## 2021-12-26 ENCOUNTER — Other Ambulatory Visit (HOSPITAL_COMMUNITY)
Admission: RE | Admit: 2021-12-26 | Discharge: 2021-12-26 | Disposition: A | Discharge status: Home / Self Care | Payer: BC Managed Care – PPO | Source: Ambulatory Visit | Attending: Internal Medicine | Admitting: Internal Medicine

## 2021-12-26 VITALS — BP 111/71 | HR 81 | Resp 16 | Wt 167.0 lb

## 2021-12-26 DIAGNOSIS — Z124 Encounter for screening for malignant neoplasm of cervix: Secondary | ICD-10-CM

## 2021-12-26 DIAGNOSIS — E611 Iron deficiency: Secondary | ICD-10-CM | POA: Diagnosis not present

## 2021-12-26 DIAGNOSIS — E538 Deficiency of other specified B group vitamins: Secondary | ICD-10-CM | POA: Diagnosis not present

## 2021-12-26 DIAGNOSIS — E559 Vitamin D deficiency, unspecified: Secondary | ICD-10-CM

## 2021-12-26 NOTE — Patient Instructions (Signed)
Take B12 supplement 1000 mcg daily.  This can be purchased over the counter. Please let me know how much Vitamin D is in your multivitamin tablet.  Once you are done with the once a week Vitamin D, start taking vitamin D 800 IU daily.

## 2021-12-26 NOTE — Progress Notes (Signed)
Patient ID: Lisa Rosario, female    DOB: 09/08/1976  MRN: [id number]  CC: Gynecologic Exam   Subjective: Lisa Rosario is a 46 y.o. female who presents for PAP Her concerns today include:  Patient with history of  childhood asthma, vitamin D deficiency, normocytic anemia, weight reduction surgery in 2009 Roux-en-Y procedure, B12/vitamin D deficiency, RLS, hidradenitis suppurativa, HL  GYN History:  Pt is G3P3 Any hx of abn paps?: no Menses regular or irregular?:  regular menses How long does menses last? 4 days Menstrual flow light or heavy?: moderate Method of birth control?:  none Any vaginal dischg at this time?:  no Dysuria?: no Any hx of STI?: no Sexually active with how many partners:  one female partner Desires STI screen:  no Last MMG:  2015.   Family hx of uterine, cervical or breast cancer?:  no  Vit B12 def: Level on last visit was 199.  I had recommended giving her vitamin B12 shots monthly for the first 2 months and then after that changing to oral vitamin B12.  She has completed the shots .  Also found to have vitamin D deficiency.  We placed her on high-dose vitamin D once a week which she has been taking consistently.   Found to have iron deficiency anemia.  She has been taking the iron supplement daily as prescribed.   Patient Active Problem List   Diagnosis Date Noted   Iron deficiency 10/28/2021   Vitamin B 12 deficiency 10/28/2021   Other insomnia 10/26/2021   RLS (restless legs syndrome) 10/26/2021   Hidradenitis suppurativa 10/26/2021   Normocytic anemia 10/26/2021   Vitamin D deficiency 10/26/2021   Obesity (BMI 30.0-34.9) 08/17/2016   Excessive sweating 05/17/2016   Depression 04/26/2015     Current Outpatient Medications on File Prior to Visit  Medication Sig Dispense Refill   Vitamin D, Ergocalciferol, (DRISDOL) 1.25 MG (50000 UNIT) CAPS capsule Take 1 capsule (50,000 Units total) by mouth every 7 (seven) days. 12 capsule 1   Cetirizine HCl 10 MG  CAPS Take 1 capsule (10 mg total) by mouth daily for 10 days. 10 capsule 0   cyclobenzaprine (FLEXERIL) 5 MG tablet Take 1 tablet (5 mg total) by mouth 3 (three) times daily as needed for muscle spasms. (Patient not taking: Reported on 10/26/2021) 20 tablet 0   ferrous sulfate 325 (65 FE) MG tablet Take 1 tablet (325 mg total) by mouth daily with breakfast. (Patient not taking: Reported on 12/26/2021) 100 tablet 1   ibuprofen (ADVIL) 600 MG tablet Take 1 tablet (600 mg total) by mouth every 8 (eight) hours as needed. (Patient not taking: Reported on 10/26/2021) 30 tablet 0   naproxen (NAPROSYN) 500 MG tablet Take 1 tablet (500 mg total) by mouth 2 (two) times daily with a meal. (Patient not taking: Reported on 10/26/2021) 30 tablet 0   sertraline (ZOLOFT) 50 MG tablet TAKE 1 TABLET BY MOUTH EVERY DAY (Patient not taking: Reported on 10/26/2021) 90 tablet 0   spironolactone (ALDACTONE) 50 MG tablet Take 50 mg by mouth daily.     sulfamethoxazole-trimethoprim (BACTRIM DS) 800-160 MG tablet Take 1 tablet by mouth 2 (two) times daily. (Patient not taking: Reported on 12/26/2021) 14 tablet 0   Current Facility-Administered Medications on File Prior to Visit  Medication Dose Route Frequency Provider Last Rate Last Admin   cyanocobalamin ((VITAMIN B-12)) injection 1,000 mcg  1,000 mcg Intramuscular Q30 days Marcine Matar, MD   1,000 mcg at 12/06/21 [phone]  No Known Allergies  Social History   Socioeconomic History   Marital status: Married    Spouse name: Not on file   Number of children: Not on file   Years of education: Not on file   Highest education level: Master's degree (e.g., MA, MS, MEng, MEd, MSW, MBA)  Occupational History   Occupation: educator  Tobacco Use   Smoking status: Never   Smokeless tobacco: Never  Vaping Use   Vaping Use: Never used  Substance and Sexual Activity   Alcohol use: No    Alcohol/week: 0.0 standard drinks   Drug use: No   Sexual activity: Yes  Other Topics  Concern   Not on file  Social History Narrative   Not on file   Social Determinants of Health   Financial Resource Strain: Not on file  Food Insecurity: Not on file  Transportation Needs: Not on file  Physical Activity: Not on file  Stress: Not on file  Social Connections: Not on file  Intimate Partner Violence: Not on file    Family History  Problem Relation Age of Onset   Hyperlipidemia Mother    Hypertension Mother    Hyperlipidemia Maternal Grandmother    Heart disease Maternal Grandmother    Hyperlipidemia Maternal Grandfather    Heart disease Maternal Grandfather    Hyperlipidemia Paternal Grandmother    Heart disease Paternal Grandmother    Hyperlipidemia Paternal Grandfather     Past Surgical History:  Procedure Laterality Date   CESAREAN SECTION     GASTRIC BYPASS  04/2008   TONSILLECTOMY Bilateral 08/08/2018   Procedure: TONSILLECTOMY;  Surgeon: Christia Reading, MD;  Location: [facility];  Service: ENT;  Laterality: Bilateral;    ROS: Review of Systems Negative except as stated above  PHYSICAL EXAM: BP 111/71    Pulse 81    Resp 16    Wt 167 lb (75.8 kg)    SpO2 100%    BMI 32.61 kg/m   Physical Exam  General appearance - alert, well appearing, and in no distress Mental status - normal mood, behavior, speech, dress, motor activity, and thought processes Pelvic - normal external genitalia, vulva, vagina, cervix, uterus and adnexa.  She has small amount of white discharge around the cervix.   CMP Latest Ref Rng & Units 10/26/2021 09/10/2019 07/15/2018  Glucose 70 - 99 mg/dL 87 87 89  BUN 6 - 24 mg/dL 8 6 8   Creatinine 0.57 - 1.00 mg/dL 1.93 7.90  Sodium 134 - 144 mmol/L 140 138 139  Potassium 3.5 - 5.2 mmol/L 4.0 4.2 3.4(L)  Chloride 96 - 106 mmol/L 103 105 106  CO2 20 - 29 mmol/L 22 28 25   Calcium 8.7 - 10.2 mg/dL 9.1 8.4 8.7  Total Protein 6.0 - 8.5 g/dL 7.2 6.3 7.5  Total Bilirubin 0.0 - 1.2 mg/dL 0.3 0.3 0.3  Alkaline Phos 44 -  121 IU/L 112 91 89  AST 0 - 40 IU/L 13 10 12   ALT 0 - 32 IU/L 5 5 5    Lipid Panel     Component Value Date/Time   CHOL 202 (H) 10/26/2021 0948   TRIG 67 10/26/2021 0948   HDL 82 10/26/2021 0948   CHOLHDL 2.5 10/26/2021 0948   CHOLHDL 3 09/10/2019 1239   VLDL 14.6 09/10/2019 1239   LDLCALC 108 (H) 10/26/2021 0948    CBC    Component Value Date/Time   WBC 8.6 10/26/2021 0948   WBC 8.4 09/10/2019 1239   RBC  3.43 (L) 10/26/2021 0948   RBC 3.19 (L) 09/10/2019 1239   HGB 7.9 (L) 10/26/2021 0948   HCT 25.9 (L) 10/26/2021 0948   PLT 456 (H) 10/26/2021 0948   MCV 76 (L) 10/26/2021 0948   MCH 23.0 (L) 10/26/2021 0948   MCH 28.3 10/13/2017 2102   MCHC 30.5 (L) 10/26/2021 0948   MCHC 32.2 09/10/2019 1239   RDW 16.3 (H) 10/26/2021 0948    ASSESSMENT AND PLAN:  1. Pap smear for cervical cancer screening We discussed whether she wanted to have mammogram done at this time versus waiting until she is 50 or at least 29 to 44 years old.  Went over recommendations from different organizations.  Mammograms done on patient's in their 40s who are not at increased risk for breast cancer can sometimes have false positive results due to dense breast tissue.  Shared decision making was done.  Patient decided that she will wait on having another mammogram until she is older. - Cytology - PAP  2. Iron deficiency Continue iron supplement.  Recheck CBC today. - CBC  3. Vitamin B 12 deficiency Advised to purchase vitamin B12 1000 mcg over-the-counter and take 1 daily.  She should remain on it given that she has had Roux-en-Y weight loss surgery in the past. - Vitamin B12  4. Vitamin D deficiency Recheck vitamin D level today.  Once her level is within normal limit and she has completed the high-dose vitamin D, we will have her take vitamin D 800 IU daily over-the-counter.  I request that she let me know via MyChart the amount of vitamin D that is in the current multivitamin tablet that she is  taking. - VITAMIN D 25 Hydroxy (Vit-D Deficiency, Fractures)    Patient was given the opportunity to ask questions.  Patient verbalized understanding of the plan and was able to repeat key elements of the plan.   Orders Placed This Encounter  Procedures   CBC   Vitamin B12   VITAMIN D 25 Hydroxy (Vit-D Deficiency, Fractures)     Requested Prescriptions    No prescriptions requested or ordered in this encounter    Return in about 6 months (around 06/25/2022).  Jonah Blue, MD, FACP

## 2021-12-27 ENCOUNTER — Telehealth: Payer: Self-pay | Admitting: Internal Medicine

## 2021-12-27 ENCOUNTER — Other Ambulatory Visit: Payer: Self-pay | Admitting: Internal Medicine

## 2021-12-27 DIAGNOSIS — E611 Iron deficiency: Secondary | ICD-10-CM

## 2021-12-27 DIAGNOSIS — E538 Deficiency of other specified B group vitamins: Secondary | ICD-10-CM

## 2021-12-27 DIAGNOSIS — E559 Vitamin D deficiency, unspecified: Secondary | ICD-10-CM

## 2021-12-27 LAB — VITAMIN B12: Vitamin B-12: 154 pg/mL — ABNORMAL LOW (ref 232–1245)

## 2021-12-27 LAB — CYTOLOGY - PAP
Comment: NEGATIVE
Diagnosis: NEGATIVE
High risk HPV: NEGATIVE

## 2021-12-27 LAB — CBC
Hematocrit: 34 % (ref 34.0–46.6)
Hemoglobin: 10.6 g/dL — ABNORMAL LOW (ref 11.1–15.9)
MCH: 26.7 pg (ref 26.6–33.0)
MCHC: 31.2 g/dL — ABNORMAL LOW (ref 31.5–35.7)
MCV: 86 fL (ref 79–97)
Platelets: 374 10*3/uL (ref 150–450)
RBC: 3.97 x10E6/uL (ref 3.77–5.28)
RDW: 16.8 % — ABNORMAL HIGH (ref 11.7–15.4)
WBC: 9.6 10*3/uL (ref 3.4–10.8)

## 2021-12-27 LAB — VITAMIN D 25 HYDROXY (VIT D DEFICIENCY, FRACTURES): Vit D, 25-Hydroxy: 19.8 ng/mL — ABNORMAL LOW (ref 30.0–100.0)

## 2021-12-27 MED ORDER — FLUCONAZOLE 150 MG PO TABS: 150.0000 mg | ORAL_TABLET | Freq: Every day | ORAL | 0 refills | Status: DC

## 2021-12-27 MED ORDER — CYANOCOBALAMIN 1000 MCG/ML IJ SOLN: 1000.0000 ug | INTRAMUSCULAR | Status: AC

## 2021-12-27 NOTE — Telephone Encounter (Signed)
Phone call placed to patient this morning to go over lab results.  Patient informed that she is still anemic but her hemoglobin has improved.  Recommend that she continue iron supplement. Vit B12 level still low at 154 with goal to get her above 350.  I recommend that she returns for 1 more vit B12 shot then start taking Vit B12 1000 IU daily from OTC. Vitamin D level still low but has improved from 13.6 to 19.8.  I told her to continue with the high-dose vitamin D.  She has another refill on the initial prescription that I gave her.  I told her to finish all the refills on that bottle then start the 800 IU daily from OTC. Return to the lab in 2 months for repeat CBC, B12 and vitamin D levels. I will have our schedulers reach out to her to schedule vitamin B12 shot and lab visit in 2 months. Message sent to [facility] admin pool.

## 2022-02-07 ENCOUNTER — Ambulatory Visit: Payer: BC Managed Care – PPO | Admitting: Dermatology

## 2022-03-07 ENCOUNTER — Ambulatory Visit (HOSPITAL_COMMUNITY)
Admission: RE | Admit: 2022-03-07 | Discharge: 2022-03-07 | Disposition: A | Discharge status: Home / Self Care | Payer: BC Managed Care – PPO | Source: Ambulatory Visit | Attending: Emergency Medicine | Admitting: Emergency Medicine

## 2022-03-07 ENCOUNTER — Encounter (HOSPITAL_COMMUNITY): Payer: Self-pay

## 2022-03-07 VITALS — BP 135/58 | HR 76 | Temp 97.6°F | Resp 18

## 2022-03-07 DIAGNOSIS — N764 Abscess of vulva: Secondary | ICD-10-CM

## 2022-03-07 MED ORDER — DOXYCYCLINE HYCLATE 100 MG PO CAPS: 100.0000 mg | ORAL_CAPSULE | Freq: Two times a day (BID) | ORAL | 0 refills | Status: DC

## 2022-03-07 NOTE — ED Provider Notes (Signed)
?[place] ? ? ? ?CSN: [id number] ?Arrival date & time: 03/07/22  [id number] ? ? ?  ? ?History   ?Chief Complaint ?Chief Complaint  ?Patient presents with  ? Abscess  ?  The is a boil on my labia majora that has been there for 8 months. I have taken 2 rounds of antibiotics and it is still there. I need to have it lanced. - Entered by patient  ? ? ?HPI ?Lisa Rosario is a 46 y.o. female.  ? ?Patient presents with boil on her right labia majora that has been present for 8 months.  Patient endorses that boil never truly goes away but flares intermittently.  Current flare has been present for 3 weeks.  Endorses erythema and tenderness but denies increase in size or drainage.  Has been using a over-the-counter boil cream which has been somewhat helpful.  Endorses that she has chronic boils and spironolactone for treatment  ? ?Past Medical History:  ?Diagnosis Date  ? Allergy   ? Chicken pox   ? Childhood asthma   ? Depression   ? ? ?Patient Active Problem List  ? Diagnosis Date Noted  ? Iron deficiency 10/28/2021  ? Vitamin B 12 deficiency 10/28/2021  ? Other insomnia 10/26/2021  ? RLS (restless legs syndrome) 10/26/2021  ? Hidradenitis suppurativa 10/26/2021  ? Normocytic anemia 10/26/2021  ? Vitamin D deficiency 10/26/2021  ? Obesity (BMI 30.0-34.9) 08/17/2016  ? Excessive sweating 05/17/2016  ? Depression 04/26/2015  ? ? ?Past Surgical History:  ?Procedure Laterality Date  ? CESAREAN SECTION    ? 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 TONSILLECTOMY Bilateral 08/08/2018  ? Procedure: TONSILLECTOMY;  Surgeon: Melida Quitter, MD;  Location: [facility];  Service: ENT;  Laterality: Bilateral;  ? ? ?OB History   ?No obstetric history on file. ?  ? ? ? ?Home Medications   ? ?Prior to Admission medications   ?Medication Sig Start Date End Date Taking? Authorizing Provider  ?Cetirizine HCl 10 MG CAPS Take 1 capsule (10 mg total) by mouth daily for 10 days. 09/28/19 10/08/19  Wieters, Hallie C, PA-C  ?ferrous sulfate 325 (65  FE) MG tablet Take 1 tablet (325 mg total) by mouth daily with breakfast. ?Patient not taking: Reported on 12/26/2021 10/28/21   Ladell Pier, MD  ?fluconazole (DIFLUCAN) 150 MG tablet Take 1 tablet (150 mg total) by mouth daily. 12/27/21   Ladell Pier, MD  ?naproxen (NAPROSYN) 500 MG tablet Take 1 tablet (500 mg total) by mouth 2 (two) times daily with a meal. ?Patient not taking: Reported on 10/26/2021 12/14/20   Jaynee Eagles, PA-C  ?sertraline (ZOLOFT) 50 MG tablet TAKE 1 TABLET BY MOUTH EVERY DAY ?Patient not taking: Reported on 10/26/2021 01/24/21   Jearld Fenton, NP  ?spironolactone (ALDACTONE) 50 MG tablet Take 50 mg by mouth daily. 11/02/21   [provider]  ?Vitamin D, Ergocalciferol, (DRISDOL) 1.25 MG (50000 UNIT) CAPS capsule Take 1 capsule (50,000 Units total) by mouth every 7 (seven) days. 10/28/21   Ladell Pier, MD  ? ? ?Family History ?Family History  ?Problem Relation Age of Onset  ? Hyperlipidemia Mother   ? Hypertension Mother   ? Hyperlipidemia Maternal Grandmother   ? Heart disease Maternal Grandmother   ? Hyperlipidemia Maternal Grandfather   ? Heart disease Maternal Grandfather   ? Hyperlipidemia Paternal Grandmother   ? Heart disease Paternal Grandmother   ? Hyperlipidemia Paternal Grandfather   ? ? ?Social History ?Social  History  ? ?Tobacco Use  ? Smoking status: Never  ? Smokeless tobacco: Never  ?Vaping Use  ? Vaping Use: Never used  ?Substance Use Topics  ? Alcohol use: No  ?  Alcohol/week: 0.0 standard drinks  ? Drug use: No  ? ? ? ?Allergies   ?Patient has no known allergies. ? ? ?Review of Systems ?Review of Systems  ?Constitutional: Negative.   ?Respiratory: Negative.    ?Cardiovascular: Negative.   ?Genitourinary: Negative.   ?Musculoskeletal: Negative.   ?Skin:  Positive for wound. Negative for color change, pallor and rash.  ? ? ?Physical Exam ?Triage Vital Signs ?ED Triage Vitals [03/07/22 0853]  ?Enc Vitals Group  ?   BP (!) 135/58  ?   Pulse Rate 76  ?    Resp 18  ?   Temp 97.6 ?F (36.4 ?C)  ?   Temp Source Oral  ?   SpO2 98 %  ?   Weight   ?   Height   ?   Head Circumference   ?   Peak Flow   ?   Pain Score 0  ?   Pain Loc   ?   Pain Edu?   ?   Excl. in Needles?   ? ?No data found. ? ?Updated Vital Signs ?BP (!) 135/58 (BP Location: Right Arm)   Pulse 76   Temp 97.6 ?F (36.4 ?C) (Oral)   Resp 18   LMP 02/25/2022   SpO2 98%  ? ?Visual Acuity ?Right Eye Distance:   ?Left Eye Distance:   ?Bilateral Distance:   ? ?Right Eye Near:   ?Left Eye Near:    ?Bilateral Near:    ? ?Physical Exam ?Constitutional:   ?   Appearance: Normal appearance.  ?HENT:  ?   Head: Normocephalic.  ?Eyes:  ?   Extraocular Movements: Extraocular movements intact.  ?Pulmonary:  ?   Effort: Pulmonary effort is normal.  ?Skin: ?   Comments: 1 x 2 abscess to the left side of the vulva, erythematous, tender and draining scant yellow purulent fluid  ?Neurological:  ?   Mental Status: She is alert and oriented to person, place, and time. Mental status is at baseline.  ?Psychiatric:     ?   Mood and Affect: Mood normal.     ?   Behavior: Behavior normal.  ? ? ? ?UC Treatments / Results  ?Labs ?(all labs ordered are listed, but only abnormal results are displayed) ?Labs Reviewed - No data to display ? ?EKG ? ? ?Radiology ?No results found. ? ?Procedures ?Procedures (including critical care time) ? ?Medications Ordered in UC ?Medications - No data to display ? ?Initial Impression / Assessment and Plan / UC Course  ?I have reviewed the triage vital signs and the nursing notes. ? ?Pertinent labs & imaging results that were available during my care of the patient were reviewed by me and considered in my medical decision making (see chart for details). ? ?Abscess of vulva ? ?As site is already draining, will defer I&D, discussed with patient, doxycycline 7-day course prescribed, advised warm compresses to the affected area, advised continued use of spironolactone for additional management, given walking  referral to gynecology and general surgery for evaluation for sac removal as site has been present for 8 months ?Final Clinical Impressions(s) / UC Diagnoses  ? ?Final diagnoses:  ?None  ? ? ? ?Discharge Instructions   ? ?  ?Take doxycycline twice daily for 7 days ? ?Hold warm-hot  compresses to affected area at least 4 times a day, this helps to facilitate draining, the more the better ? ?Please return for evaluation for increased swelling, increased tenderness or pain, non healing site, non draining site, you begin to have fever or chills  ? ?We reviewed the etiology of recurrent abscesses of skin.  Skin abscesses are collections of pus within the dermis and deeper skin tissues. Skin abscesses manifest as painful, tender, fluctuant, and erythematous nodules, frequently surmounted by a pustule and surrounded by a rim of erythematous swelling.  Spontaneous drainage of purulent material may occur.  Fever can occur on occasion.   ? ?-Skin abscesses can develop in healthy individuals with no predisposing conditions other than skin or nasal carriage of Staphylococcus aureus.  Individuals in close contact with others who have active infection with skin abscesses are at increased risk which is likely to explain why twin brother has similar episodes.   In addition, any process leading to a breach in the skin barrier can also predispose to the development of a skin abscesses, such as atopic dermatitis.    ? ? ?ED Prescriptions   ?None ?  ? ?PDMP not reviewed this encounter. ?  ?Hans Eden, NP ?03/07/22 [id number] ? ?

## 2022-03-07 NOTE — ED Triage Notes (Signed)
Pt states has chronic boils for the past 8 months. C/o boil to vaginal x43months, no difference today. Denies drainage. ?

## 2022-03-07 NOTE — Discharge Instructions (Addendum)
Take doxycycline twice daily for 7 days ? ?Hold warm-hot compresses to affected area at least 4 times a day, this helps to facilitate draining, the more the better ? ?Please return for evaluation for increased swelling, increased tenderness or pain, non healing site, non draining site, you begin to have fever or chills  ? ?As your abscess has been reoccurring the constant in the exact same spot it is most likely a sac underneath the skin that is refilling, please reach out to [facility] which is gynecology for removal ? ?We reviewed the etiology of recurrent abscesses of skin.  Skin abscesses are collections of pus within the dermis and deeper skin tissues. Skin abscesses manifest as painful, tender, fluctuant, and erythematous nodules, frequently surmounted by a pustule and surrounded by a rim of erythematous swelling.  Spontaneous drainage of purulent material may occur.  Fever can occur on occasion.   ? ?-Skin abscesses can develop in healthy individuals with no predisposing conditions other than skin or nasal carriage of Staphylococcus aureus.  Individuals in close contact with others who have active infection with skin abscesses are at increased risk which is likely to explain why twin brother has similar episodes.   In addition, any process leading to a breach in the skin barrier can also predispose to the development of a skin abscesses, such as atopic dermatitis.    ?

## 2022-04-10 ENCOUNTER — Other Ambulatory Visit: Payer: Self-pay | Admitting: Internal Medicine

## 2022-04-11 NOTE — Telephone Encounter (Signed)
Requested medication (s) are due for refill today: yes  Requested medication (s) are on the active medication list: yes  Last refill:  10/28/21  Future visit scheduled: yes  Notes to clinic:  Unable to refill per protocol, cannot delegate. 50,000 units needs PCP review for refill.     Requested Prescriptions  Pending Prescriptions Disp Refills   Vitamin D, Ergocalciferol, (DRISDOL) 1.25 MG (50000 UNIT) CAPS capsule [Pharmacy Med Name: VITAMIN D2 1.25MG (50,000 UNIT)] 12 capsule 1    Sig: Take 1 capsule (50,000 Units total) by mouth every 7 (seven) days.     Endocrinology:  Vitamins - Vitamin D Supplementation 2 Failed - 04/10/2022 12:33 PM      Failed - Manual Review: Route requests for 50,000 IU strength to the provider      Failed - Vitamin D in normal range and within 360 days    VITD  Date Value Ref Range Status  09/10/2019 11.75 (L) 30.00 - 100.00 ng/mL Final   Vit D, 25-Hydroxy  Date Value Ref Range Status  12/26/2021 19.8 (L) 30.0 - 100.0 ng/mL Final    Comment:    Vitamin D deficiency has been defined by the Institute of Medicine and an Endocrine Society practice guideline as a level of serum 25-OH vitamin D less than 20 ng/mL (1,2). The Endocrine Society went on to further define vitamin D insufficiency as a level between 21 and 29 ng/mL (2). 1. IOM (Institute of Medicine). 2010. Dietary reference    intakes for calcium and D. [place]: The    [employer]. 2. Holick MF, Binkley [place], Bischoff-Ferrari HA, et al.    Evaluation, treatment, and prevention of vitamin D    deficiency: an Endocrine Society clinical practice    guideline. JCEM. 2011 Jul; 96(7):1911-30.          Passed - Ca in normal range and within 360 days    Calcium  Date Value Ref Range Status  10/26/2021 9.1 8.7 - 10.2 mg/dL Final         Passed - Valid encounter within last 12 months    Recent Outpatient Visits           3 months ago Pap smear for cervical cancer screening    [facility] Marcine Matar, MD   5 months ago Establishing care with new doctor, encounter for   [facility] Marcine Matar, MD       Future Appointments             In 2 months Laural Benes Binnie Rail, MD [facility]

## 2022-06-25 ENCOUNTER — Ambulatory Visit: Payer: BC Managed Care – PPO | Admitting: Internal Medicine

## 2023-03-06 ENCOUNTER — Encounter: Payer: Self-pay | Admitting: Gastroenterology

## 2023-03-06 ENCOUNTER — Ambulatory Visit (AMBULATORY_SURGERY_CENTER): Payer: BC Managed Care – PPO

## 2023-03-06 VITALS — Ht 60.0 in | Wt 180.0 lb

## 2023-03-06 DIAGNOSIS — Z1211 Encounter for screening for malignant neoplasm of colon: Secondary | ICD-10-CM

## 2023-03-06 MED ORDER — NA SULFATE-K SULFATE-MG SULF 17.5-3.13-1.6 GM/177ML PO SOLN: 1.0000 | Freq: Once | ORAL | 0 refills | Status: AC

## 2023-03-06 MED ORDER — NA SULFATE-K SULFATE-MG SULF 17.5-3.13-1.6 GM/177ML PO SOLN: 1.0000 | Freq: Once | ORAL | 0 refills | Status: DC

## 2023-03-06 NOTE — Progress Notes (Addendum)
Pre visit completed via phone call; Patient verified name, DOB, and address;  No egg or soy allergy known to patient;  No issues known to pt with past sedation with any surgeries or procedures; Patient denies ever being told they had issues or difficulty with intubation;  No FH of Malignant Hyperthermia; Pt is not on diet pills; Pt is not on home 02;  Pt is not on blood thinners;  Pt denies issues with constipation  No A fib or A flutter; Have any cardiac testing pending--NO Pt instructed to use Singlecare.com or GoodRx for a price reduction on prep;   Insurance verified during PV appt=[employer] [place]  Previsit completed and red dot placed by patient's name on their procedure day (on provider's schedule).    Good Rx [facility] coupon and instructions printed and mailed to the patient per her request;

## 2023-03-15 ENCOUNTER — Telehealth: Payer: Self-pay | Admitting: Gastroenterology

## 2023-03-15 NOTE — Telephone Encounter (Signed)
Spoke with pt and discussed with pt prep and when to take it. He son is Printmaker and she has to attend the meeting r/t that and does not want to start the prep medication and then have to "run". She states its an hour long maybe less. Instructed pt to start prep as soon as she gets home and not to delay more than an hour. Pt states she will.

## 2023-03-15 NOTE — Telephone Encounter (Signed)
Patient is supposed to start prep on 4/30 and has a meeting at 6pm. She wants to know will it affect her prep. Please advise.

## 2023-03-20 ENCOUNTER — Ambulatory Visit (AMBULATORY_SURGERY_CENTER): Payer: BC Managed Care – PPO | Admitting: Gastroenterology

## 2023-03-20 ENCOUNTER — Encounter: Payer: Self-pay | Admitting: Gastroenterology

## 2023-03-20 VITALS — BP 132/72 | HR 50 | Temp 97.5°F | Resp 12 | Ht 60.0 in | Wt 180.0 lb

## 2023-03-20 DIAGNOSIS — K635 Polyp of colon: Secondary | ICD-10-CM | POA: Diagnosis not present

## 2023-03-20 DIAGNOSIS — D125 Benign neoplasm of sigmoid colon: Secondary | ICD-10-CM | POA: Diagnosis not present

## 2023-03-20 DIAGNOSIS — Z1211 Encounter for screening for malignant neoplasm of colon: Secondary | ICD-10-CM | POA: Diagnosis present

## 2023-03-20 DIAGNOSIS — D123 Benign neoplasm of transverse colon: Secondary | ICD-10-CM | POA: Diagnosis not present

## 2023-03-20 MED ORDER — SODIUM CHLORIDE 0.9 % IV SOLN: 500.0000 mL | Freq: Once | INTRAVENOUS | Status: DC

## 2023-03-20 NOTE — Progress Notes (Signed)
Called to room to assist during endoscopic procedure.  Patient ID and intended procedure confirmed with present staff. Received instructions for my participation in the procedure from the performing physician.  

## 2023-03-20 NOTE — Op Note (Addendum)
[facility] Patient Name: Lisa Rosario Procedure Date: 03/20/2023 10:23 AM MRN: [id number] Endoscopist: Lorin Picket E. Tomasa Rand , MD, [id number] Age: 47 Referring MD:  Date of Birth: 1976/01/16 Gender: Female Account #: [id number] Procedure:                Colonoscopy Indications:              Screening for colorectal malignant neoplasm, This                            is the patient's first colonoscopy Medicines:                Monitored Anesthesia Care Procedure:                Pre-Anesthesia Assessment:                           - Prior to the procedure, a History and Physical                            was performed, and patient medications and                            allergies were reviewed. The patient's tolerance of                            previous anesthesia was also reviewed. The risks                            and benefits of the procedure and the sedation                            options and risks were discussed with the patient.                            All questions were answered, and informed consent                            was obtained. Prior Anticoagulants: The patient has                            taken no anticoagulant or antiplatelet agents. ASA                            Grade Assessment: I - A normal, healthy patient.                            After reviewing the risks and benefits, the patient                            was deemed in satisfactory condition to undergo the                            procedure.  After obtaining informed consent, the colonoscope                            was passed under direct vision. Throughout the                            procedure, the patient's blood pressure, pulse, and                            oxygen saturations were monitored continuously. The                            CF [id number] #[id number] was introduced through the anus                            and advanced to the the  terminal ileum, with                            identification of the appendiceal orifice and IC                            valve. The colonoscopy was somewhat difficult due                            to poor endoscopic visualization in the sigmoid                            colon due to difficulty maintaining insufflation,                            significant looping and a tortuous colon. The                            patient tolerated the procedure well. The quality                            of the bowel preparation was good. The terminal                            ileum, ileocecal valve, appendiceal orifice, and                            rectum were photographed. The bowel preparation                            used was SUPREP via split dose instruction. Scope In: 10:38:11 AM Scope Out: 11:02:29 AM Scope Withdrawal Time: 0 hours 19 minutes 56 seconds  Total Procedure Duration: 0 hours 24 minutes 18 seconds  Findings:                 The perianal and digital rectal examinations were                            normal. Pertinent negatives include normal  sphincter tone and no palpable rectal lesions.                           A 4 mm polyp was found in the transverse colon. The                            polyp was sessile. The polyp was removed with a                            cold snare. Resection and retrieval were complete.                            Estimated blood loss was minimal.                           Two sessile polyps were found in the splenic                            flexure. The polyps were 3 to 4 mm in size. These                            polyps were removed with a cold snare. Resection                            and retrieval were complete. Estimated blood loss                            was minimal.                           A 4 mm polyp was found in the sigmoid colon. The                            polyp was flat. The polyp was removed  with a cold                            snare. Resection was complete, but the polyp tissue                            was not retrieved. Estimated blood loss was minimal.                           The exam was otherwise normal throughout the                            examined colon.                           The terminal ileum appeared normal.                           The retroflexed view of the distal rectum and anal  verge was normal and showed no anal or rectal                            abnormalities. Complications:            No immediate complications. Estimated Blood Loss:     Estimated blood loss was minimal. Impression:               - One 4 mm polyp in the transverse colon, removed                            with a cold snare. Resected and retrieved.                           - Two 3 to 4 mm polyps at the splenic flexure,                            removed with a cold snare. Resected and retrieved.                           - One 4 mm polyp in the sigmoid colon, removed with                            a cold snare. Complete resection. Polyp tissue not                            retrieved.                           - The examined portion of the ileum was normal.                           - The distal rectum and anal verge are normal on                            retroflexion view.                           - The GI Genius (intelligent endoscopy module),                            computer-aided polyp detection system powered by AI                            was utilized to detect colorectal polyps through                            enhanced visualization during colonoscopy. Recommendation:           - Patient has a contact number available for                            emergencies. The signs and symptoms of potential  delayed complications were discussed with the                            patient. Return to normal activities  tomorrow.                            Written discharge instructions were provided to the                            patient.                           - Resume previous diet.                           - Continue present medications.                           - Await pathology results.                           - Repeat colonoscopy (date not yet determined) for                            surveillance based on pathology results. Latisha Lasch E. Tomasa Rand, MD 03/20/2023 11:07:41 AM This report has been signed electronically.

## 2023-03-20 NOTE — Progress Notes (Signed)
Sedate, gd SR, tolerated procedure well, VSS, report to RN 

## 2023-03-20 NOTE — Progress Notes (Signed)
Vitals-DT  Pt's states no medical or surgical changes since previsit or office visit.  

## 2023-03-20 NOTE — Progress Notes (Signed)
[facility] History and Physical   Primary Care Physician:  Barnie Mort, PA-C   Reason for Procedure:   Colon cancer screening  Plan:    Screening colonoscopy     HPI: Lisa Rosario is a 47 y.o. female undergoing initial average risk screening colonoscopy.  She has no family history of colon cancer and no chronic GI symptoms.    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 Authorizing Provider  sertraline (ZOLOFT) 50 MG tablet TAKE 1 TABLET BY MOUTH EVERY DAY 01/24/21  Yes Baity, Salvadore Oxford, NP  cyanocobalamin (VITAMIN B12) 1000 MCG tablet Take 1,000 mcg by mouth daily. 02/13/23   [provider]  ferrous sulfate 325 (65 FE) MG tablet Take 1 tablet (325 mg total) by mouth daily with breakfast. 10/28/21   Marcine Matar, MD  VITAMIN D PO Take 1 tablet by mouth daily at 6 (six) AM.    [provider]    Current Outpatient Medications  Medication Sig Dispense Refill   sertraline (ZOLOFT) 50 MG tablet TAKE 1 TABLET BY MOUTH EVERY DAY 90 tablet 0   cyanocobalamin (VITAMIN B12) 1000 MCG tablet Take 1,000 mcg by mouth daily.     ferrous sulfate 325 (65 FE) MG tablet Take 1 tablet (325 mg total) by mouth daily with breakfast. 100 tablet 1   VITAMIN D PO Take 1 tablet by mouth daily at 6 (six) AM.     Current  Facility-Administered Medications  Medication Dose Route Frequency Provider Last Rate Last Admin   0.9 %  sodium chloride infusion  500 mL Intravenous Once Jenel Lucks, MD        Allergies as of 03/20/2023 - Review Complete 03/20/2023  Allergen Reaction Noted   No known allergies  03/20/2023    Family History  Problem Relation Age of Onset   Hyperlipidemia Mother    Hypertension Mother    Hyperlipidemia Maternal Grandmother    Heart disease Maternal Grandmother    Hyperlipidemia Maternal Grandfather    Heart disease Maternal Grandfather    Hyperlipidemia Paternal Grandmother    Heart disease Paternal Grandmother    Hyperlipidemia Paternal Grandfather    Colon polyps Neg Hx    Colon cancer Neg Hx    Esophageal cancer Neg Hx    Stomach cancer Neg Hx    Rectal cancer Neg Hx     Social History   Socioeconomic History   Marital status: Married    Spouse name: Not on file   Number of children: Not on file   Years of education: Not on file   Highest education level: Master's degree (e.g., MA, MS, MEng, MEd, MSW, MBA)  Occupational History   Occupation: Programmer, systems  Tobacco Use   Smoking status: Never   Smokeless tobacco: Never  Vaping Use   Vaping Use: Never used  Substance and Sexual Activity   Alcohol use: No    Alcohol/week: 0.0 standard drinks of alcohol   Drug use: No   Sexual activity: Yes    Birth control/protection: None  Other Topics Concern   Not on file  Social History Narrative   Not on file   Social Determinants of Health   Financial Resource Strain: Not on file  Food Insecurity: Not on file  Transportation Needs: Not on file  Physical Activity: Not on file  Stress: Not on file  Social Connections: Not on file  Intimate Partner Violence: Not on file    Review of Systems:  All other review of systems negative except as mentioned in the HPI.  Physical Exam: Vital signs BP 115/70 (BP Location: Right Arm, Patient Position: Sitting, Cuff  Size: Normal)   Pulse (!) 50   Temp (!) 97.5 F (36.4 C) (Temporal)   Ht 5' (1.524 m)   Wt 180 lb (81.6 kg)   SpO2 98%   BMI 35.15 kg/m   General:   Alert,  Well-developed, well-nourished, pleasant and cooperative in NAD Airway:  Mallampati 2 Lungs:  Clear throughout to auscultation.   Heart:  Regular rate and rhythm; no murmurs, clicks, rubs,  or gallops. Abdomen:  Soft, nontender and nondistended. Normal bowel sounds.   Neuro/Psych:  Normal mood and affect. A and O x 3   Brielyn Bosak E. Tomasa Rand, MD [facility]

## 2023-03-20 NOTE — Patient Instructions (Addendum)
Resume previous diet. Continue present medications. Await pathology results. Repeat colonoscopy (date not yet determined) for surveillance based on pathology results.  YOU HAD AN ENDOSCOPIC PROCEDURE TODAY AT THE [facility]:   Refer to the procedure report that was given to you for any specific questions about what was found during the examination.  If the procedure report does not answer your questions, please call your gastroenterologist to clarify.  If you requested that your care partner not be given the details of your procedure findings, then the procedure report has been included in a sealed envelope for you to review at your convenience later.  YOU SHOULD EXPECT: Some feelings of bloating in the abdomen. Passage of more gas than usual.  Walking can help get rid of the air that was put into your GI tract during the procedure and reduce the bloating. If you had a lower endoscopy (such as a colonoscopy or flexible sigmoidoscopy) you may notice spotting of blood in your stool or on the toilet paper. If you underwent a bowel prep for your procedure, you may not have a normal bowel movement for a few days.  Please Note:  You might notice some irritation and congestion in your nose or some drainage.  This is from the oxygen used during your procedure.  There is no need for concern and it should clear up in a day or so.  SYMPTOMS TO REPORT IMMEDIATELY:  Following lower endoscopy (colonoscopy or flexible sigmoidoscopy):  Excessive amounts of blood in the stool  Significant tenderness or worsening of abdominal pains  Swelling of the abdomen that is new, acute  Fever of 100F or higher  For urgent or emergent issues, a gastroenterologist can be reached at any hour by calling [phone]. Do not use MyChart messaging for urgent concerns.    DIET:  We do recommend a small meal at first, but then you may proceed to your regular diet.  Drink plenty of fluids but you should avoid  alcoholic beverages for 24 hours.  ACTIVITY:  You should plan to take it easy for the rest of today and you should NOT DRIVE or use heavy machinery until tomorrow (because of the sedation medicines used during the test).    FOLLOW UP: Our staff will call the number listed on your records the next business day following your procedure.  We will call around 7:15- 8:00 am to check on you and address any questions or concerns that you may have regarding the information given to you following your procedure. If we do not reach you, we will leave a message.     If any biopsies were taken you will be contacted by phone or by letter within the next 1-3 weeks.  Please call us at [phone] if you have not heard about the biopsies in 3 weeks.    SIGNATURES/CONFIDENTIALITY: You and/or your care partner have signed paperwork which will be entered into your electronic medical record.  These signatures attest to the fact that that the information above on your After Visit Summary has been reviewed and is understood.  Full responsibility of the confidentiality of this discharge information lies with you and/or your care-partner. 

## 2023-03-21 ENCOUNTER — Telehealth: Payer: Self-pay | Admitting: *Deleted

## 2023-03-21 NOTE — Telephone Encounter (Signed)
  Follow up Call-     03/20/2023    9:56 AM 03/20/2023    9:48 AM  Call back number  Post procedure Call Back phone  # [phone]   Permission to leave phone message  Yes     Patient questions:  Do you have a fever, pain , or abdominal swelling? No. Pain Score  0 *  Have you tolerated food without any problems? Yes.    Have you been able to return to your normal activities? Yes.    Do you have any questions about your discharge instructions: Diet   No. Medications  No. Follow up visit  No.  Do you have questions or concerns about your Care? No.  Actions: * If pain score is 4 or above: No action needed, pain <4.

## 2023-03-27 NOTE — Progress Notes (Signed)
Ms. Kuennen,   The four polyps that I removed during your recent procedure were completely benign but were proven to be "pre-cancerous" polyps that MAY have grown into cancers if they had not been removed.  Studies shows that at least 20% of women over age 45 and 30% of men over age 7 have pre-cancerous polyps.  Based on current nationally recognized surveillance guidelines, I recommend that you have a repeat colonoscopy in 5 years.   If you develop any new rectal bleeding, abdominal pain or significant bowel habit changes, please contact me before then.

## 2024-12-01 ENCOUNTER — Telehealth: Payer: Self-pay | Admitting: Physician Assistant

## 2024-12-01 NOTE — Telephone Encounter (Signed)
 Copied from CRM [id number]. Topic: Appointments - Transfer of Care >> Dec 01, 2024 11:06 AM Chasity T wrote: Pt is requesting to transfer FROM: Lisa Rosario Pt is requesting to transfer TO: [place] Reason for requested transfer: no longer taking adult patients It is the responsibility of the team the patient would like to transfer to (Dr. Bennett Patella) to reach out to the patient if for any reason this transfer is not acceptable.

## 2024-12-02 NOTE — Telephone Encounter (Signed)
 Ok for [employer]

## 2025-01-28 ENCOUNTER — Encounter
